# Patient Record
Sex: Female | Born: 1965 | Race: Black or African American | Hispanic: No | Smoking: Never smoker
Health system: Southern US, Community
[De-identification: ages and names within clinical notes are randomized; demographics above are authoritative.]

## PROBLEM LIST (undated history)

## (undated) DIAGNOSIS — R51 Headache: Secondary | ICD-10-CM

## (undated) DIAGNOSIS — F32A Depression, unspecified: Secondary | ICD-10-CM

## (undated) DIAGNOSIS — F329 Major depressive disorder, single episode, unspecified: Secondary | ICD-10-CM

## (undated) DIAGNOSIS — Z8489 Family history of other specified conditions: Secondary | ICD-10-CM

## (undated) DIAGNOSIS — M25512 Pain in left shoulder: Secondary | ICD-10-CM

## (undated) DIAGNOSIS — G709 Myoneural disorder, unspecified: Secondary | ICD-10-CM

## (undated) DIAGNOSIS — D649 Anemia, unspecified: Secondary | ICD-10-CM

## (undated) HISTORY — PX: OTHER SURGICAL HISTORY: SHX169

## (undated) HISTORY — DX: Pain in left shoulder: M25.512

---

## 2002-03-15 ENCOUNTER — Ambulatory Visit (HOSPITAL_COMMUNITY): Admission: RE | Admit: 2002-03-15 | Discharge: 2002-03-15 | Payer: Self-pay | Admitting: Family Medicine

## 2002-03-15 ENCOUNTER — Encounter: Payer: Self-pay | Admitting: Family Medicine

## 2002-09-14 ENCOUNTER — Encounter: Payer: Self-pay | Admitting: Family Medicine

## 2002-09-14 ENCOUNTER — Ambulatory Visit (HOSPITAL_COMMUNITY): Admission: RE | Admit: 2002-09-14 | Discharge: 2002-09-14 | Payer: Self-pay | Admitting: Family Medicine

## 2002-10-22 ENCOUNTER — Inpatient Hospital Stay (HOSPITAL_COMMUNITY): Admission: AD | Admit: 2002-10-22 | Discharge: 2002-10-22 | Payer: Self-pay | Admitting: *Deleted

## 2002-10-22 ENCOUNTER — Encounter (INDEPENDENT_AMBULATORY_CARE_PROVIDER_SITE_OTHER): Payer: Self-pay | Admitting: *Deleted

## 2002-10-22 ENCOUNTER — Encounter: Payer: Self-pay | Admitting: *Deleted

## 2003-02-14 ENCOUNTER — Emergency Department (HOSPITAL_COMMUNITY): Admission: EM | Admit: 2003-02-14 | Discharge: 2003-02-14 | Payer: Self-pay | Admitting: Emergency Medicine

## 2005-04-16 ENCOUNTER — Ambulatory Visit (HOSPITAL_COMMUNITY): Admission: RE | Admit: 2005-04-16 | Discharge: 2005-04-16 | Payer: Self-pay | Admitting: Family Medicine

## 2005-06-02 ENCOUNTER — Ambulatory Visit (HOSPITAL_COMMUNITY): Admission: RE | Admit: 2005-06-02 | Discharge: 2005-06-02 | Payer: Self-pay | Admitting: Family Medicine

## 2005-10-11 ENCOUNTER — Observation Stay (HOSPITAL_COMMUNITY): Admission: AD | Admit: 2005-10-11 | Discharge: 2005-10-11 | Payer: Self-pay | Admitting: Obstetrics & Gynecology

## 2005-11-14 ENCOUNTER — Ambulatory Visit (HOSPITAL_COMMUNITY): Admission: AD | Admit: 2005-11-14 | Discharge: 2005-11-14 | Payer: Self-pay | Admitting: Obstetrics & Gynecology

## 2007-03-24 ENCOUNTER — Encounter: Admission: RE | Admit: 2007-03-24 | Discharge: 2007-03-24 | Payer: Self-pay | Admitting: Family Medicine

## 2008-05-03 ENCOUNTER — Encounter (HOSPITAL_COMMUNITY): Admission: RE | Admit: 2008-05-03 | Discharge: 2008-06-02 | Payer: Self-pay | Admitting: Family Medicine

## 2008-06-03 ENCOUNTER — Encounter (HOSPITAL_COMMUNITY): Admission: RE | Admit: 2008-06-03 | Discharge: 2008-07-03 | Payer: Self-pay | Admitting: Family Medicine

## 2008-06-07 ENCOUNTER — Ambulatory Visit (HOSPITAL_COMMUNITY): Admission: RE | Admit: 2008-06-07 | Discharge: 2008-06-07 | Payer: Self-pay | Admitting: Family Medicine

## 2008-08-06 ENCOUNTER — Encounter: Admission: RE | Admit: 2008-08-06 | Discharge: 2008-08-06 | Payer: Self-pay | Admitting: Obstetrics and Gynecology

## 2009-11-24 ENCOUNTER — Encounter: Admission: RE | Admit: 2009-11-24 | Discharge: 2009-11-24 | Payer: Self-pay | Admitting: Obstetrics and Gynecology

## 2010-06-21 ENCOUNTER — Encounter: Payer: Self-pay | Admitting: *Deleted

## 2010-10-15 ENCOUNTER — Encounter: Payer: Self-pay | Admitting: Gastroenterology

## 2010-10-15 ENCOUNTER — Ambulatory Visit: Payer: Self-pay | Admitting: Urgent Care

## 2010-10-15 ENCOUNTER — Ambulatory Visit (INDEPENDENT_AMBULATORY_CARE_PROVIDER_SITE_OTHER): Payer: Self-pay | Admitting: Gastroenterology

## 2010-10-15 VITALS — BP 122/82 | HR 63 | Temp 97.3°F | Ht 67.0 in | Wt 163.2 lb

## 2010-10-15 DIAGNOSIS — K921 Melena: Secondary | ICD-10-CM
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
## 2010-10-15 NOTE — Patient Instructions (Signed)
Please contact Lubertha Basque for assistance for the upcoming colonoscopy.  We will be setting up a colonoscopy very soon to assess your colon.  Further recommendations after this is completed.

## 2010-10-16 NOTE — Discharge Summary (Signed)
NAME:  Holly Little, CADE NO.:  [id number]   MEDICAL RECORD NO.:  [id number]          PATIENT TYPE:  OBV   LOCATION:  LDR3                          FACILITY:  [facility]   PHYSICIAN:  Richardean Canal, M.D.  DATE OF BIRTH:  03/16/1966   DATE OF ADMISSION:  11/14/2005  DATE OF DISCHARGE:  06/17/2007LH                                 DISCHARGE SUMMARY   HISTORY:  This patient came to the hospital at [redacted] weeks gestation  complaining of leaking of fluid.  The patient had received prenatal care,  and the dates were well established by ultrasound.  The patient was  presenting vertex with leaking fluid.  The Nitrazine test was positive.  Fetal heart rate was normal on the fetal monitor.  The patient was not in  labor, and no contractions were noted on the fetal monitor.  [facility] was contacted for transfer and did not have a bed.  A bed was found  in [place], and the patient was accepted in transfer at [facility].                                            ______________________________  Richardean Canal, M.D.     RW/MEDQ  D:  11/15/2005  T:  11/15/2005  Job:  [id number]

## 2010-10-16 NOTE — Consult Note (Signed)
NAME:  Holly Little, Holly Little                       ACCOUNT NO.:  [id number]   MEDICAL RECORD NO.:  [id number]                   PATIENT TYPE:  MAT   LOCATION:  MATC                                 FACILITY:  [facility]   PHYSICIAN:  Georgina Peer, M.D.              DATE OF BIRTH:  10-16-1965   DATE OF CONSULTATION:  DATE OF DISCHARGE:                                   CONSULTATION   REFERRING PHYSICIAN:  Sheppard Penton. Stacie Acres, M.D.   REASON FOR CONSULTATION:  I was asked to see this patient at the request of  Dr. Mariel Aloe at the [facility] Emergency Department at [facility]. [facility] as a GYN consult to evaluate vaginal bleeding and possible  miscarriage.   HISTORY AND PHYSICAL:  The patient is a 45 year old gravida 5, para 2-1-2-2,  six weeks from her last menstrual period who presented to [facility]  Emergency Department with vaginal bleeding and cramping.  To expedite  radiologic evaluation and consultation, she was transferred to [facility] by [facility].  The patient was known to have an intrauterine  gestational sac on Oct 08, 2002, under the care of Dr. Emelda Fear in  [place], [place].  The patient had a quantitative HCG of 8200 on  Oct 08, 2002, and a follow-up ultrasound showing no cardiac activity and a  diagnosis of a missed AB.  Presenting today, quantitative HCG was 4000.  Her  hemoglobin was 12.3, WBC 11.3, platelet count 176,000.  She was also placed  on Macrobid for a urinary tract infection.   PAST MEDICAL HISTORY:  She has had two vaginal deliveries.  She has no  ongoing medical problems.   ALLERGIES:  No known drug allergies.   MEDICATIONS:  She is on Macrodantin and prenatal vitamins as her only  medications.   PHYSICAL EXAMINATION:  VITAL SIGNS:  Afebrile, blood pressure 134/75.  ABDOMEN:  Soft, flat and nontender.  No rebound, masses or guarding.  PELVIC:  There is dark blood in the vaginal vault.  Clot in the cervix; when  removed,  minimal dark bleeding was noted.  The os was then closed.  Uterus  top normal size.  An ultrasound had shown thickened endometrium  and regular  fluid collection in the endocervical canal, probable SAB.   Her urinalysis revealed 100 of glucose, 40 of ketones, large blood, 100 of  protein, positive nitrites, positive large leukocytes and 7 to 10 white  cells and too numerous to count red cells and a clean catch specimen.  A  culture will be done.   IMPRESSION:  Complete abortion, minimal bleeding.    DISPOSITION:  The patient was given Methergine 0.2 p.o. and Motrin 800 p.o.  She will be watched for one half hour and will be discharged back to her  home to follow up with Dr. Emelda Fear in [place], [place], within  several days.  She states her  blood type is B positive.                                               Georgina Peer, M.D.    JPN/MEDQ  D:  10/23/2002  T:  10/23/2002  Job:  [id number]   cc:   Tilda Burrow, M.D.  [address] [place]  [place] [address]  Fax: [phone]

## 2010-10-16 NOTE — H&P (Signed)
NAME:  Holly Little, Holly Little NO.:  [id number]   MEDICAL RECORD NO.:  [id number]          PATIENT TYPE:  OBV   LOCATION:  LDR2                          FACILITY:  [facility]   PHYSICIAN:  Tilda Burrow, M.D. DATE OF BIRTH:  1965/07/24   DATE OF ADMISSION:  10/11/2005  DATE OF DISCHARGE:  LH                                HISTORY & PHYSICAL   REASON FOR ADMISSION:  Pregnancy at 47 weeks with lower back pain and  cramping for about 7-8 hours.   HISTORY OF PRESENT ILLNESS:  Holly Little is a 45 year old gravida 3, para 2, EDC  is January 10, 2006, who presents to the office with low back pain and  tightening in her lower abdomen since early a.m.   PHYSICAL EXAMINATION:  Vital signs are stable.  Cervix is a fingertip,  thick, posterior, ballots.  Electronic fetal monitor was applied.  The  patient states that since she has been at the hospital her discomfort has  abated and no uterine contractions are palpated or noted on the monitor.   PLAN:  We are going to discharge [place] home.  She is to follow up with  reoccurrence or as needed.  She is to see me in the office Wednesday.  At  that time I will do a fetal fibronectin and possible Brethine therapy due to  the fact that she has had a preterm delivery in the past.      Zerita Boers, Lanier Clam      Tilda Burrow, M.D.  Electronically Signed    DL/MEDQ  D:  16/03/9603  T:  10/11/2005  Job:  [id number]

## 2010-10-16 NOTE — Group Therapy Note (Signed)
NAME:  Holly Little, Holly Little                       ACCOUNT NO.:  [id number]   MEDICAL RECORD NO.:  [id number]                   PATIENT TYPE:   LOCATION:                                       FACILITY:  [facility]   PHYSICIAN:  Langley Gauss, M.D.                DATE OF BIRTH:  1966/04/22   DATE OF PROCEDURE:  09/16/2002  DATE OF DISCHARGE:                                   PROGRESS NOTE   The patient is a 45 year old gravida 5, para 2.  The patient had absolutely  no notable last menstrual period for any dating purposes. The patient was a  new patient in our office initially seen on 09/06/2002 reporting a first  home positive pregnancy test on 09/03/2002 with no other tests having been  done.  The patient did complain of left lower quadrant pain, which she  described as sharp, but this had been present times several months.  In  addition she describes breast tenderness which had also been present times  several months.  A transvaginal ultrasound performed on 09/06/2002 revealed  a very irregular, small intrauterine sac too small to identify any  structures within it.  The ovaries were normal bilaterally with no free  fluid identified and subsequently a quantitative beta hCG obtained on that  day was 258.2.  The patient had been given instructions to follow up 48  hours later for a repeat quantitative beta hCG; however, the patient did not  follow up until 96 hours later at which time a quantitative beta hCG had  only increased to 579.2.  The patient was again seen in the office with full  recognition that this was not a normal doubling time; but again these 2  specimens were not run side-by-side for comparative analysis.   The patient was then seen for a follow up in our office on 09/13/2002.  At  that period of time there was a very small amount of free pelvic fluid.  The  ovaries were normal bilaterally. The intrauterine sac had markedly increased  in size and was well-visualized with a  maximum diameter of 0.050 cm but no  structures visible within this sac.  The sac was regular in shape.  The  patient again had no significant change in her status.  In that she did have  an occasional left lower quadrant pain which she described had been present  times several months duration.  She had absolutely no vaginal bleeding.  She  did have some nausea without vomiting, but no significant change in her  breast tenderness.  The patient was advised again that this was an  inconclusive study as we did not identify any fetal structures within the  sac, however, the patient's asymptomatic nature was reassuring.   Subsequently a repeat quantitative beta hCG results obtained on 09/13/2002  revealed a number of 2512.0.  The patient was advised that this could  represent  a normal intrauterine pregnancy with very early ultrasounds too  soon to identify fetal structures within intrauterine sac.  Another  possibility was that of an abnormal intrauterine pregnancy which could  possibly lead to fetal loss; and, again, until proven otherwise ectopic  pregnancy is a consideration.  The patient subsequently left our office and  we are contacted the following day on 09/14/2002.   The patient had gone to [facility] upon her own accord to obtain  a second opinion.  Report that I have obtained is that the patient had a  transvaginal ultrasound performed at [facility] on 09/14/2002 which  was consistent with the ultrasound findings that we had had from the day  previously.  The patient was not given any information different regarding  this pregnancy than we had already given her; that of possible early, normal  intrauterine pregnancy; abnormal intrauterine pregnancy; or there is the  possibility, still persisting, of an ectopic pregnancy.   Our plan, at present, which has been rearranged with the patient is that the  patient is to follow up at [facility] on 09/18/2002  for repeat  transvaginal ultrasound to assess the ongoing pregnancy.  [facility]  will be cross covering in my absence.                                               Langley Gauss, M.D.    DC/MEDQ  D:  09/16/2002  T:  09/17/2002  Job:  [id number]   cc:   [facility]

## 2010-10-17 DIAGNOSIS — K921 Melena: Secondary | ICD-10-CM | POA: Insufficient documentation

## 2010-10-17 NOTE — Assessment & Plan Note (Signed)
45 year old pleasant female with 2 episodes of large amount painless hematochezia. Denies constipation, hx of hemorrhoids. Did have 2 separate episodes of lower abdominal pain lasting approximately 20 minutes, not associated with episodes of hematochezia. Denies fever, chills, wt loss. Complains of fatigue, reportedly anemic. No current labs available (Hgb checked at PCP's office via fingerstick). FH of colon cancer in 1st degree relative (father), diagnosed at age 19, last year, living. Hematochezia may be related to benign anorectal source, yet with family hx, concern for occult malignancy. Will proceed with colonoscopy in the very near future.  TCS with Dr. Darrick Penna: the R/B/A have been discussed in detail with the pt. She stated understanding and has no further questions. She is agreeable to proceeding. Obtain financial assistance with Lubertha Basque ASAP.  Attempt to retrieve most recent results of Hgb.

## 2010-10-19 NOTE — Progress Notes (Signed)
Cc to PCP 

## 2010-10-27 ENCOUNTER — Encounter: Payer: Self-pay | Admitting: Gastroenterology

## 2010-10-30 NOTE — Progress Notes (Signed)
agree

## 2010-11-09 ENCOUNTER — Ambulatory Visit (HOSPITAL_COMMUNITY): Admission: RE | Admit: 2010-11-09 | Payer: Self-pay | Source: Ambulatory Visit | Admitting: Gastroenterology

## 2010-11-09 ENCOUNTER — Encounter: Payer: Self-pay | Admitting: Gastroenterology

## 2010-12-21 ENCOUNTER — Encounter: Payer: Self-pay | Admitting: General Practice

## 2010-12-21 ENCOUNTER — Telehealth: Payer: Self-pay | Admitting: General Practice

## 2010-12-21 NOTE — Telephone Encounter (Signed)
Instructed pt to hold her Iron for 7 days prior to her procedure.

## 2010-12-21 NOTE — Telephone Encounter (Signed)
Per Dr Darrick Penna its okay for pt to do Trilyte Prep

## 2010-12-21 NOTE — Telephone Encounter (Signed)
Hold iron for 7 days. MOVIPREP. 

## 2010-12-21 NOTE — Telephone Encounter (Signed)
Gastroenterology Pre-Procedure Form  Request Date: 12/21/2010,  Requesting Physician: DR. Lubertha South     PATIENT INFORMATION:  Holly Little is a 45 y.o., female (DOB=03/30/1966).  PROCEDURE: Procedure(s) requested: colonoscopy Procedure Reason: rectal bleeding  PATIENT REVIEW QUESTIONS: The patient reports the following:   1. Diabetes Melitis: no 2. Joint replacements in the past 12 months: no 3. Major health problems in the past 3 months: no 4. Has an artificial valve or MVP:no 5. Has been advised in past to take antibiotics in advance of a procedure like teeth cleaning: no}    MEDICATIONS & ALLERGIES:    Patient reports the following regarding taking any blood thinners:   Plavix?  Aspirin?no Coumadin?  no  Patient confirms/reports the following medications:  Current Outpatient Prescriptions  Medication Sig Dispense Refill  . ferrous sulfate 325 (65 FE) MG tablet Take 325 mg by mouth daily with breakfast.        . Multiple Vitamin (MULTIVITAMIN) capsule Take 1 capsule by mouth daily.          Patient confirms/reports the following allergies:  No Known Allergies  Patient is appropriate to schedule for requested procedure(s): yes  AUTHORIZATION INFORMATION Primary Insurance: SELF PAY,    SCHEDULE INFORMATION: Procedure has been scheduled as follows:  Date: 01/04/2011, Time: 10:30AM  Location: [facility]  This Gastroenterology Pre-Precedure Form is being routed to the following provider(s) for review: [name], MD

## 2011-01-01 ENCOUNTER — Encounter: Payer: Self-pay | Admitting: Gastroenterology

## 2011-01-04 ENCOUNTER — Encounter (HOSPITAL_COMMUNITY)
Admission: RE | Disposition: A | Discharge status: Home / Self Care | Payer: Self-pay | Source: Ambulatory Visit | Attending: Gastroenterology

## 2011-01-04 ENCOUNTER — Encounter (HOSPITAL_COMMUNITY): Payer: Self-pay

## 2011-01-04 ENCOUNTER — Encounter: Payer: Self-pay | Admitting: Gastroenterology

## 2011-01-04 ENCOUNTER — Other Ambulatory Visit: Payer: Self-pay | Admitting: Gastroenterology

## 2011-01-04 ENCOUNTER — Ambulatory Visit (HOSPITAL_COMMUNITY)
Admission: RE | Admit: 2011-01-04 | Discharge: 2011-01-04 | Disposition: A | Discharge status: Home / Self Care | Payer: Self-pay | Source: Ambulatory Visit | Attending: Gastroenterology | Admitting: Gastroenterology

## 2011-01-04 DIAGNOSIS — Z800 Family history of malignant neoplasm of digestive organs: Secondary | ICD-10-CM

## 2011-01-04 DIAGNOSIS — D126 Benign neoplasm of colon, unspecified: Secondary | ICD-10-CM | POA: Insufficient documentation

## 2011-01-04 DIAGNOSIS — K921 Melena: Secondary | ICD-10-CM | POA: Insufficient documentation

## 2011-01-04 DIAGNOSIS — K648 Other hemorrhoids: Secondary | ICD-10-CM | POA: Insufficient documentation

## 2011-01-04 DIAGNOSIS — K625 Hemorrhage of anus and rectum: Secondary | ICD-10-CM

## 2011-01-04 DIAGNOSIS — R109 Unspecified abdominal pain: Secondary | ICD-10-CM | POA: Insufficient documentation

## 2011-01-04 HISTORY — DX: Anemia, unspecified: D64.9

## 2011-01-04 HISTORY — DX: Headache: R51

## 2011-01-04 HISTORY — DX: Depression, unspecified: F32.A

## 2011-01-04 HISTORY — PX: COLONOSCOPY: SHX5424

## 2011-01-04 HISTORY — DX: Major depressive disorder, single episode, unspecified: F32.9

## 2011-01-04 SURGERY — COLONOSCOPY
Anesthesia: Moderate Sedation

## 2011-01-04 MED ORDER — MIDAZOLAM HCL 5 MG/5ML IJ SOLN
INTRAMUSCULAR | Status: DC | PRN
  Administered 2011-01-04 (×2): 2 mg via INTRAVENOUS

## 2011-01-04 MED ORDER — MIDAZOLAM HCL 5 MG/5ML IJ SOLN
INTRAMUSCULAR | Status: AC
  Filled 2011-01-04: qty 10

## 2011-01-04 MED ORDER — MEPERIDINE HCL 100 MG/ML IJ SOLN
INTRAMUSCULAR | Status: DC | PRN
  Administered 2011-01-04: 50 mg via INTRAVENOUS
  Administered 2011-01-04: 25 mg via INTRAVENOUS

## 2011-01-04 MED ORDER — STERILE WATER FOR IRRIGATION IR SOLN
Status: DC | PRN
  Administered 2011-01-04: 12:00:00

## 2011-01-04 MED ORDER — MEPERIDINE HCL 100 MG/ML IJ SOLN
INTRAMUSCULAR | Status: AC
  Filled 2011-01-04: qty 2

## 2011-01-04 NOTE — Interval H&P Note (Signed)
History and Physical Interval Note:   01/04/2011   11:36 AM   Holly Little  has presented today for surgery, with the diagnosis of rectal bleeding  The various methods of treatment have been discussed with the patient and family. After consideration of risks, benefits and other options for treatment, the patient has consented to  Procedure(s): COLONOSCOPY as a surgical intervention .  I have reviewed the patients' chart and labs.  Questions were answered to the patient's satisfaction.     [name]  MD
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
## 2011-01-06 ENCOUNTER — Telehealth: Payer: Self-pay | Admitting: Gastroenterology

## 2011-01-06 NOTE — Telephone Encounter (Signed)
Tried to call pt- NA 

## 2011-01-06 NOTE — Telephone Encounter (Signed)
Results Cc to PCP 5 yr reminder is in the computer

## 2011-01-06 NOTE — Telephone Encounter (Signed)
Please call pt. She had a hyperplastic polyp removed. TCS in 5 years because her father had colonCA. High fiber diet.

## 2011-01-12 NOTE — Telephone Encounter (Signed)
Pt aware, she stated she has not had a BM since the procedure. Pt wants to know what would be the best thing for her to try. Please advise.

## 2011-01-12 NOTE — Telephone Encounter (Signed)
Please call pt. She should stop iron until she has a BM. She should drink 6-8 cups of water daily and add Miralax twice daily for 3 days then once daily.

## 2011-01-13 ENCOUNTER — Encounter (HOSPITAL_COMMUNITY): Payer: Self-pay | Admitting: Gastroenterology

## 2011-01-13 NOTE — Telephone Encounter (Signed)
Tried to call pt- NA 

## 2011-01-14 NOTE — Telephone Encounter (Signed)
Pt aware.

## 2011-04-16 ENCOUNTER — Telehealth: Payer: Self-pay | Admitting: Gastroenterology

## 2011-04-16 ENCOUNTER — Encounter: Payer: Self-pay | Admitting: Gastroenterology

## 2011-04-16 NOTE — Telephone Encounter (Signed)
Pt is on Dec 2012 recall list to have CBC prior to 4 month OV

## 2011-04-19 ENCOUNTER — Other Ambulatory Visit: Payer: Self-pay

## 2011-04-19 DIAGNOSIS — K921 Melena: Secondary | ICD-10-CM

## 2011-04-19 NOTE — Telephone Encounter (Signed)
Lab order on file for 05/03/2011. To go out end of Nov.

## 2011-05-13 ENCOUNTER — Ambulatory Visit: Payer: Self-pay | Admitting: Gastroenterology

## 2012-11-23 ENCOUNTER — Ambulatory Visit (INDEPENDENT_AMBULATORY_CARE_PROVIDER_SITE_OTHER): Payer: Self-pay | Admitting: Nurse Practitioner

## 2012-11-23 ENCOUNTER — Encounter: Payer: Self-pay | Admitting: Nurse Practitioner

## 2012-11-23 VITALS — BP 132/90 | Temp 98.5°F | Wt 168.4 lb

## 2012-11-23 DIAGNOSIS — R5383 Other fatigue: Secondary | ICD-10-CM

## 2012-11-23 DIAGNOSIS — R102 Pelvic and perineal pain: Secondary | ICD-10-CM

## 2012-11-23 DIAGNOSIS — R5381 Other malaise: Secondary | ICD-10-CM

## 2012-11-23 DIAGNOSIS — N912 Amenorrhea, unspecified: Secondary | ICD-10-CM

## 2012-11-23 DIAGNOSIS — N949 Unspecified condition associated with female genital organs and menstrual cycle: Secondary | ICD-10-CM

## 2012-11-24 ENCOUNTER — Encounter: Payer: Self-pay | Admitting: Nurse Practitioner

## 2012-11-24 DIAGNOSIS — N912 Amenorrhea, unspecified: Secondary | ICD-10-CM | POA: Insufficient documentation

## 2012-11-24 LAB — HCG, SERUM, QUALITATIVE: Preg, Serum: NEGATIVE

## 2012-11-24 LAB — CBC WITH DIFFERENTIAL/PLATELET
Basophils Absolute: 0 10*3/uL (ref 0.0–0.1)
Basophils Relative: 0 % (ref 0–1)
Eosinophils Absolute: 0.1 10*3/uL (ref 0.0–0.7)
Eosinophils Relative: 2 % (ref 0–5)
HCT: 36.6 % (ref 36.0–46.0)
Hemoglobin: 12.2 g/dL (ref 12.0–15.0)
Monocytes Absolute: 0.4 10*3/uL (ref 0.1–1.0)
Monocytes Relative: 8 % (ref 3–12)
Neutro Abs: 3 10*3/uL (ref 1.7–7.7)
RBC: 4.33 MIL/uL (ref 3.87–5.11)
RDW: 13.8 % (ref 11.5–15.5)
WBC: 5.5 10*3/uL (ref 4.0–10.5)

## 2012-11-24 LAB — HEPATIC FUNCTION PANEL
ALT: 19 U/L (ref 0–35)
AST: 21 U/L (ref 0–37)
Albumin: 4.3 g/dL (ref 3.5–5.2)
Alkaline Phosphatase: 51 U/L (ref 39–117)
Total Protein: 7.5 g/dL (ref 6.0–8.3)

## 2012-11-24 LAB — BASIC METABOLIC PANEL
Calcium: 9.8 mg/dL (ref 8.4–10.5)
Chloride: 104 mEq/L (ref 96–112)
Potassium: 3.9 mEq/L (ref 3.5–5.3)

## 2012-11-24 LAB — PROGESTERONE: Progesterone: 0.3 ng/mL

## 2012-11-24 LAB — FOLLICLE STIMULATING HORMONE: FSH: 58 m[IU]/mL

## 2012-11-24 NOTE — Progress Notes (Signed)
Subjective:  Presents for complaints of weight gain, abdominal bloating, night sweats and breast tenderness. Has not had a menstrual cycle in 4 years. Some fatigue. Sleeping well. No unusual stress. No fever. No vaginal discharge. Will have an occasional very brief sharp pelvic pain every 2-3 weeks. Resolves on its own. Same sexual partner. Takes vitamin D daily. Limited amount of workup due to lack of insurance.  Objective:   BP 132/90  Temp(Src) 98.5 F (36.9 C) (Oral)  Wt 168 lb 6.4 oz (76.386 kg)  BMI 26.37 kg/m2 NAD. Alert, oriented. Thyroid normal limit to palpation and nontender. Lungs clear. Heart regular rate rhythm. Abdomen mild soft distention, nontender. No obvious organomegaly.  Assessment/Plan:Absence of menstruation - Plan: POCT urine pregnancy, FSH, LH, Estradiol, Progesterone, hCG, serum, qualitative, FSH, LH, Estradiol, Progesterone, hCG, serum, qualitative  Pelvic pain in female - Plan: GC/chlamydia probe amp, urine, FSH, LH, Estradiol, Progesterone, hCG, serum, qualitative, GC/chlamydia probe amp, urine, FSH, LH, Estradiol, Progesterone, hCG, serum, qualitative  Other malaise and fatigue - Plan: Basic metabolic panel, CBC with Differential, Hepatic function panel, TSH, Basic metabolic panel, CBC with Differential, Hepatic function panel, TSH  depending on lab work, patient may need pelvic ultrasound.

## 2012-11-24 NOTE — Assessment & Plan Note (Signed)
Assessment/Plan:Absence of menstruation - Plan: POCT urine pregnancy, FSH, LH, Estradiol, Progesterone, hCG, serum, qualitative, FSH, LH, Estradiol, Progesterone, hCG, serum, qualitative

## 2012-11-25 LAB — GC/CHLAMYDIA PROBE AMP: GC Probe RNA: NEGATIVE

## 2012-12-13 ENCOUNTER — Emergency Department (HOSPITAL_COMMUNITY)
Admission: EM | Admit: 2012-12-13 | Discharge: 2012-12-13 | Disposition: A | Discharge status: Home / Self Care | Payer: Worker's Compensation | Attending: Emergency Medicine | Admitting: Emergency Medicine

## 2012-12-13 ENCOUNTER — Encounter (HOSPITAL_COMMUNITY): Payer: Self-pay | Admitting: *Deleted

## 2012-12-13 ENCOUNTER — Emergency Department (HOSPITAL_COMMUNITY): Payer: Worker's Compensation

## 2012-12-13 DIAGNOSIS — S39012A Strain of muscle, fascia and tendon of lower back, initial encounter: Secondary | ICD-10-CM

## 2012-12-13 DIAGNOSIS — S9002XA Contusion of left ankle, initial encounter: Secondary | ICD-10-CM

## 2012-12-13 DIAGNOSIS — Y9389 Activity, other specified: Secondary | ICD-10-CM | POA: Insufficient documentation

## 2012-12-13 DIAGNOSIS — Z862 Personal history of diseases of the blood and blood-forming organs and certain disorders involving the immune mechanism: Secondary | ICD-10-CM | POA: Insufficient documentation

## 2012-12-13 DIAGNOSIS — Z8659 Personal history of other mental and behavioral disorders: Secondary | ICD-10-CM | POA: Insufficient documentation

## 2012-12-13 DIAGNOSIS — S335XXA Sprain of ligaments of lumbar spine, initial encounter: Secondary | ICD-10-CM | POA: Insufficient documentation

## 2012-12-13 DIAGNOSIS — Y92410 Unspecified street and highway as the place of occurrence of the external cause: Secondary | ICD-10-CM | POA: Insufficient documentation

## 2012-12-13 DIAGNOSIS — S9000XA Contusion of unspecified ankle, initial encounter: Secondary | ICD-10-CM | POA: Insufficient documentation

## 2012-12-13 MED ORDER — TRAMADOL HCL 50 MG PO TABS: 50.0000 mg | ORAL_TABLET | Freq: Four times a day (QID) | ORAL | Status: DC | PRN
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
## 2013-02-26 ENCOUNTER — Other Ambulatory Visit: Payer: Self-pay | Admitting: Emergency Medicine

## 2013-03-15 ENCOUNTER — Telehealth: Payer: Self-pay

## 2013-03-15 NOTE — Telephone Encounter (Signed)
[facility] orders from Dr. Clelia Croft for the patients appointment tomorrow at 8am. Their fax number is: [phone]

## 2013-03-16 NOTE — Telephone Encounter (Signed)
Is this work comp? If so please pull chart.

## 2013-03-16 NOTE — Telephone Encounter (Signed)
??    No note in epic that she has been seen at [facility].  Was this a worker's comp pt? The name does not ring a bell.  Could this be meant for a different Dr. Clelia Croft in town?

## 2013-04-13 ENCOUNTER — Telehealth: Payer: Self-pay

## 2013-04-13 NOTE — Telephone Encounter (Signed)
[facility] called regarding pts treatment. Pt states dr Clelia Croft wants her to continue her PT. [facility] states they do not have a continuing order for PT and if dr Clelia Croft would like pt to continue therapy, they need a continuing order faxed to them.  [facility] Phone: [phone] Fax: [phone]  bf

## 2013-04-13 NOTE — Telephone Encounter (Signed)
This is WC, Kriste Basque will send note in [facility], Sebastian Lurz

## 2013-05-09 ENCOUNTER — Telehealth: Payer: Self-pay | Admitting: Radiology

## 2013-05-09 NOTE — Telephone Encounter (Signed)
Discussed case w/ RJ - gets relief from PT but then her sxs will recur outside of office, so she has had any improvement in functional strength.  This is work comp, please print out and pull chart. I do not remember this case. If she has plateaued, then she definitely needs to come back in for further eval.

## 2013-05-09 NOTE — Telephone Encounter (Signed)
Spoke to physical therapy, patient has improved some, wants to discuss with you. Call back number for therapist [phone] RJ is his name. He wants to discuss if you think patient could come back in for an injection to get her some improvement, she seems to have plateaued.

## 2013-05-09 NOTE — Telephone Encounter (Signed)
Sorry, means has NOT had improvement in functional strength. RJ thinks that if she were on a steroid dose pack or had a steroid injection to decrease the inflammation that she might then be able to regain some strength in PT.  Advised the pt needs to come in for OV for eval and considering of steroid or injection. Pt will plan to come in this evening.  Please print note and place in [facility] chart. Thanks.

## 2013-08-13 ENCOUNTER — Other Ambulatory Visit: Payer: Self-pay | Admitting: Neurosurgery

## 2013-10-16 ENCOUNTER — Encounter (HOSPITAL_COMMUNITY): Admission: RE | Payer: Self-pay | Source: Ambulatory Visit

## 2013-10-16 ENCOUNTER — Inpatient Hospital Stay (HOSPITAL_COMMUNITY)
Admission: RE | Admit: 2013-10-16 | Payer: No Typology Code available for payment source | Source: Ambulatory Visit | Admitting: Neurosurgery

## 2013-10-16 SURGERY — POSTERIOR LUMBAR FUSION 1 LEVEL
Anesthesia: General | Site: Back

## 2013-11-22 DIAGNOSIS — Z0289 Encounter for other administrative examinations: Secondary | ICD-10-CM

## 2015-11-26 ENCOUNTER — Encounter: Payer: Self-pay | Admitting: Gastroenterology

## 2016-10-04 ENCOUNTER — Emergency Department (HOSPITAL_COMMUNITY): Payer: BLUE CROSS/BLUE SHIELD

## 2016-10-04 ENCOUNTER — Encounter (HOSPITAL_COMMUNITY): Payer: Self-pay | Admitting: Emergency Medicine

## 2016-10-04 ENCOUNTER — Emergency Department (HOSPITAL_COMMUNITY)
Admission: EM | Admit: 2016-10-04 | Discharge: 2016-10-04 | Disposition: A | Discharge status: Home / Self Care | Payer: BLUE CROSS/BLUE SHIELD | Attending: Emergency Medicine | Admitting: Emergency Medicine

## 2016-10-04 DIAGNOSIS — S3992XA Unspecified injury of lower back, initial encounter: Secondary | ICD-10-CM | POA: Diagnosis present

## 2016-10-04 DIAGNOSIS — S161XXA Strain of muscle, fascia and tendon at neck level, initial encounter: Secondary | ICD-10-CM | POA: Insufficient documentation

## 2016-10-04 DIAGNOSIS — Y9389 Activity, other specified: Secondary | ICD-10-CM | POA: Diagnosis not present

## 2016-10-04 DIAGNOSIS — Y92410 Unspecified street and highway as the place of occurrence of the external cause: Secondary | ICD-10-CM | POA: Diagnosis not present

## 2016-10-04 DIAGNOSIS — S63501A Unspecified sprain of right wrist, initial encounter: Secondary | ICD-10-CM | POA: Diagnosis not present

## 2016-10-04 DIAGNOSIS — S39012A Strain of muscle, fascia and tendon of lower back, initial encounter: Secondary | ICD-10-CM | POA: Insufficient documentation

## 2016-10-04 DIAGNOSIS — Y999 Unspecified external cause status: Secondary | ICD-10-CM | POA: Diagnosis not present

## 2016-10-04 MED ORDER — METHOCARBAMOL 500 MG PO TABS: 500.0000 mg | ORAL_TABLET | Freq: Three times a day (TID) | ORAL | 0 refills | Status: DC

## 2016-10-04 MED ORDER — IBUPROFEN 800 MG PO TABS: 800.0000 mg | ORAL_TABLET | Freq: Three times a day (TID) | ORAL | 0 refills | Status: DC

## 2016-10-04 MED ORDER — TRAMADOL HCL 50 MG PO TABS: 50.0000 mg | ORAL_TABLET | Freq: Four times a day (QID) | ORAL | 0 refills | Status: DC | PRN

## 2016-10-04 MED ORDER — IBUPROFEN 800 MG PO TABS
800.0000 mg | ORAL_TABLET | Freq: Once | ORAL | Status: AC
  Administered 2016-10-04: 800 mg via ORAL
  Filled 2016-10-04: qty 1
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
## 2016-10-04 NOTE — Discharge Instructions (Signed)
Apply ice packs on/off your neck and back.  Follow-up with your primary provider in a few days if the symptoms are not improving.  Return here for any worsening symptoms
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
## 2016-10-04 NOTE — ED Notes (Signed)
Tammy notified that hr dropped to 51, is at bedside at this time, and is okay with pt being discharged at this time.

## 2016-10-04 NOTE — ED Notes (Signed)
Pt made aware to return if symptoms worsen or if any life threatening symptoms occur.   

## 2016-10-11 ENCOUNTER — Encounter: Payer: Self-pay | Admitting: Family Medicine

## 2016-10-11 ENCOUNTER — Ambulatory Visit (INDEPENDENT_AMBULATORY_CARE_PROVIDER_SITE_OTHER): Payer: Self-pay | Admitting: Family Medicine

## 2016-10-11 VITALS — BP 132/84 | Ht 67.0 in | Wt 175.0 lb

## 2016-10-11 DIAGNOSIS — S161XXA Strain of muscle, fascia and tendon at neck level, initial encounter: Secondary | ICD-10-CM

## 2016-10-11 DIAGNOSIS — M545 Low back pain, unspecified: Secondary | ICD-10-CM

## 2016-10-11 MED ORDER — ETODOLAC 400 MG PO TABS: 400.0000 mg | ORAL_TABLET | Freq: Two times a day (BID) | ORAL | 0 refills | Status: DC

## 2016-10-11 MED ORDER — METHOCARBAMOL 500 MG PO TABS: 500.0000 mg | ORAL_TABLET | Freq: Three times a day (TID) | ORAL | 0 refills | Status: DC | PRN

## 2016-10-11 MED ORDER — TRAMADOL HCL 50 MG PO TABS: 50.0000 mg | ORAL_TABLET | Freq: Four times a day (QID) | ORAL | 0 refills | Status: DC | PRN
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
## 2016-10-20 ENCOUNTER — Encounter: Payer: Self-pay | Admitting: Family Medicine

## 2016-10-20 ENCOUNTER — Ambulatory Visit (INDEPENDENT_AMBULATORY_CARE_PROVIDER_SITE_OTHER): Payer: Self-pay | Admitting: Family Medicine

## 2016-10-20 VITALS — BP 122/82 | Ht 67.0 in | Wt 177.5 lb

## 2016-10-20 DIAGNOSIS — S161XXA Strain of muscle, fascia and tendon at neck level, initial encounter: Secondary | ICD-10-CM

## 2016-10-20 DIAGNOSIS — G8911 Acute pain due to trauma: Secondary | ICD-10-CM

## 2016-10-20 DIAGNOSIS — M778 Other enthesopathies, not elsewhere classified: Secondary | ICD-10-CM

## 2016-10-20 DIAGNOSIS — M25512 Pain in left shoulder: Secondary | ICD-10-CM
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
## 2016-10-26 ENCOUNTER — Encounter: Payer: Self-pay | Admitting: Orthopaedic Surgery

## 2016-10-26 ENCOUNTER — Ambulatory Visit (INDEPENDENT_AMBULATORY_CARE_PROVIDER_SITE_OTHER): Payer: Self-pay | Admitting: Orthopaedic Surgery

## 2016-10-26 VITALS — BP 133/85 | HR 70 | Temp 97.7°F | Ht 66.0 in | Wt 175.0 lb

## 2016-10-26 DIAGNOSIS — S63641A Sprain of metacarpophalangeal joint of right thumb, initial encounter: Secondary | ICD-10-CM

## 2016-10-26 DIAGNOSIS — M542 Cervicalgia: Secondary | ICD-10-CM

## 2016-10-26 DIAGNOSIS — S5331XA Traumatic rupture of right ulnar collateral ligament, initial encounter: Secondary | ICD-10-CM
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
## 2016-11-03 ENCOUNTER — Ambulatory Visit (INDEPENDENT_AMBULATORY_CARE_PROVIDER_SITE_OTHER): Payer: Self-pay | Admitting: Family Medicine

## 2016-11-03 ENCOUNTER — Encounter: Payer: Self-pay | Admitting: Family Medicine

## 2016-11-03 ENCOUNTER — Ambulatory Visit (HOSPITAL_COMMUNITY): Payer: No Typology Code available for payment source

## 2016-11-03 VITALS — BP 128/80 | Ht 67.0 in | Wt 177.0 lb

## 2016-11-03 DIAGNOSIS — S161XXA Strain of muscle, fascia and tendon at neck level, initial encounter: Secondary | ICD-10-CM

## 2016-11-03 MED ORDER — METHOCARBAMOL 500 MG PO TABS: 500.0000 mg | ORAL_TABLET | Freq: Three times a day (TID) | ORAL | 0 refills | Status: DC | PRN

## 2016-11-03 MED ORDER — ETODOLAC 400 MG PO TABS: 400.0000 mg | ORAL_TABLET | Freq: Two times a day (BID) | ORAL | 0 refills | Status: DC

## 2016-11-03 MED ORDER — TRAMADOL HCL 50 MG PO TABS: 50.0000 mg | ORAL_TABLET | Freq: Four times a day (QID) | ORAL | 0 refills | Status: DC | PRN
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
## 2016-11-05 ENCOUNTER — Ambulatory Visit (HOSPITAL_COMMUNITY): Payer: BLUE CROSS/BLUE SHIELD

## 2016-11-05 ENCOUNTER — Encounter (HOSPITAL_COMMUNITY): Payer: Self-pay

## 2016-11-05 ENCOUNTER — Ambulatory Visit (HOSPITAL_COMMUNITY): Payer: BLUE CROSS/BLUE SHIELD | Attending: Family Medicine

## 2016-11-05 ENCOUNTER — Ambulatory Visit (HOSPITAL_COMMUNITY): Payer: Self-pay

## 2016-11-05 DIAGNOSIS — M542 Cervicalgia: Secondary | ICD-10-CM

## 2016-11-05 DIAGNOSIS — R29898 Other symptoms and signs involving the musculoskeletal system: Secondary | ICD-10-CM | POA: Diagnosis not present

## 2016-11-05 DIAGNOSIS — M25612 Stiffness of left shoulder, not elsewhere classified: Secondary | ICD-10-CM | POA: Diagnosis present

## 2016-11-05 DIAGNOSIS — M25512 Pain in left shoulder: Secondary | ICD-10-CM | POA: Insufficient documentation
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
## 2016-11-05 NOTE — Patient Instructions (Addendum)
  Scalene Stretch, Sitting  Repeat __10_ times per session. Do _1-2__ sessions per day.  Copyright  VHI. All rights reserved.  Scalene Stretch, Sitting   Sit, one hand tucked under hip on side to be stretched, other hand over top of head. Gently pull head to side and backwards. Hold ___ seconds. For more stretch, lean body in direction of head pull. Repeat ___ times per session. Do ___ sessions per day.  Copyright  VHI. All rights reserved.  Side Bend, Standing   Stand, one hand grasping other arm above wrist behind body. Pull down while gently tilting head toward pulling side. Hold ___ seconds. Repeat ___ times per session. Do ___ sessions per day.  Copyright  VHI. All rights reserved.  Levator Scapula Stretch, Sitting   Sit, one hand on same-side shoulder blade, other hand on head. Gently pull head down and away. Hold ___ seconds. Repeat ___ times per session. Do ___ sessions per day.  Copyright  VHI. All rights reserved.  Lower Cervical / Upper Thoracic Stretch   Clasp hands together in front with arms extended. Gently pull shoulder blades apart and bend head forward. Hold ____ seconds. Repeat ____ times per set. Do ____ sets per session. Do ____ sessions per day.  http://orth.exer.us/354   Copyright  VHI. All rights reserved.  Axial Extension (Chin Tuck)   Pull chin in and lengthen back of neck. Hold ____ seconds while counting out loud. Repeat ____ times. Do ____ sessions per day.  http://gt2.exer.us/449   Copyright  VHI. All rights reserved.    Doorway Stretch  Place each hand opposite each other on the doorway. (You can change where you feel the stretch by moving arms higher or lower.) Step through with one foot and bend front knee until a stretch is felt and hold. Step through with the opposite foot on the next rep. Hold for __10___ seconds. Repeat __2__times.     Scapular Retraction (Standing)   With arms at sides, pinch shoulder blades  together. Repeat __10__ times per set. Do ____ sets per session. Do ____ sessions per day.  http://orth.exer.us/944   Copyright  VHI. All rights reserved.      Posterior Capsule Stretch   Stand or sit, one arm across body so hand rests over opposite shoulder. Gently push on crossed elbow with other hand until stretch is felt in shoulder of crossed arm. Hold _10__ seconds.  Repeat _1__ times per session. Do ___ sessions per day.   Wall Flexion  Using a towel, slide your arm up the wall until a stretch is felt in your shoulder . Hold for 10 seconds. Repeat 2 times.

## 2016-11-08 ENCOUNTER — Telehealth (HOSPITAL_COMMUNITY): Payer: Self-pay

## 2016-11-08 ENCOUNTER — Ambulatory Visit (HOSPITAL_COMMUNITY): Payer: BLUE CROSS/BLUE SHIELD

## 2016-11-08 NOTE — Telephone Encounter (Signed)
Called patient regarding no show. Patient reports that she confused her appointment today with her appointment with Dr. Luna Glasgow tomorrow. Did not realize that she had mixed up her appointments. We'll put her on the wait list if anything opens up before her next appointment on Thursday. Patient is aware of her next scheduled appointment.   Ailene Ravel, OTR/L,CBIS  [phone]

## 2016-11-09 ENCOUNTER — Encounter: Payer: Self-pay | Admitting: Orthopaedic Surgery

## 2016-11-09 ENCOUNTER — Ambulatory Visit (INDEPENDENT_AMBULATORY_CARE_PROVIDER_SITE_OTHER): Payer: Self-pay | Admitting: Orthopaedic Surgery

## 2016-11-09 VITALS — BP 113/78 | HR 69 | Temp 97.7°F | Ht 67.0 in | Wt 174.0 lb

## 2016-11-09 DIAGNOSIS — S5331XD Traumatic rupture of right ulnar collateral ligament, subsequent encounter: Secondary | ICD-10-CM

## 2016-11-09 DIAGNOSIS — M542 Cervicalgia: Secondary | ICD-10-CM

## 2016-11-09 DIAGNOSIS — S63641D Sprain of metacarpophalangeal joint of right thumb, subsequent encounter: Secondary | ICD-10-CM
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
## 2016-11-11 ENCOUNTER — Encounter (HOSPITAL_COMMUNITY): Payer: Self-pay

## 2016-11-11 ENCOUNTER — Ambulatory Visit (HOSPITAL_COMMUNITY): Payer: BLUE CROSS/BLUE SHIELD

## 2016-11-11 DIAGNOSIS — M25612 Stiffness of left shoulder, not elsewhere classified: Secondary | ICD-10-CM

## 2016-11-11 DIAGNOSIS — M25512 Pain in left shoulder: Secondary | ICD-10-CM

## 2016-11-11 DIAGNOSIS — R29898 Other symptoms and signs involving the musculoskeletal system: Secondary | ICD-10-CM

## 2016-11-11 DIAGNOSIS — M542 Cervicalgia: Secondary | ICD-10-CM
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
## 2016-11-18 ENCOUNTER — Encounter (HOSPITAL_COMMUNITY): Payer: Self-pay

## 2016-11-18 ENCOUNTER — Ambulatory Visit (HOSPITAL_COMMUNITY): Payer: BLUE CROSS/BLUE SHIELD

## 2016-11-18 DIAGNOSIS — M25612 Stiffness of left shoulder, not elsewhere classified: Secondary | ICD-10-CM

## 2016-11-18 DIAGNOSIS — R29898 Other symptoms and signs involving the musculoskeletal system: Secondary | ICD-10-CM | POA: Diagnosis not present

## 2016-11-18 DIAGNOSIS — M25512 Pain in left shoulder: Secondary | ICD-10-CM
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
## 2016-11-22 ENCOUNTER — Ambulatory Visit (HOSPITAL_COMMUNITY): Payer: BLUE CROSS/BLUE SHIELD | Admitting: Occupational Therapy

## 2016-11-22 ENCOUNTER — Encounter (HOSPITAL_COMMUNITY): Payer: Self-pay | Admitting: Occupational Therapy

## 2016-11-22 DIAGNOSIS — M25512 Pain in left shoulder: Secondary | ICD-10-CM

## 2016-11-22 DIAGNOSIS — R29898 Other symptoms and signs involving the musculoskeletal system: Secondary | ICD-10-CM | POA: Diagnosis not present

## 2016-11-22 DIAGNOSIS — M542 Cervicalgia: Secondary | ICD-10-CM

## 2016-11-22 DIAGNOSIS — M25612 Stiffness of left shoulder, not elsewhere classified: Secondary | ICD-10-CM
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
## 2016-11-24 ENCOUNTER — Encounter: Payer: Self-pay | Admitting: Family Medicine

## 2016-11-24 ENCOUNTER — Ambulatory Visit (INDEPENDENT_AMBULATORY_CARE_PROVIDER_SITE_OTHER): Payer: Self-pay | Admitting: Family Medicine

## 2016-11-24 VITALS — BP 128/82 | Ht 67.0 in | Wt 175.2 lb

## 2016-11-24 DIAGNOSIS — S161XXA Strain of muscle, fascia and tendon at neck level, initial encounter: Secondary | ICD-10-CM

## 2016-11-24 MED ORDER — ETODOLAC 400 MG PO TABS: 400.0000 mg | ORAL_TABLET | Freq: Two times a day (BID) | ORAL | 0 refills | Status: DC

## 2016-11-24 MED ORDER — METHOCARBAMOL 500 MG PO TABS
500.0000 mg | ORAL_TABLET | Freq: Three times a day (TID) | ORAL | 0 refills | Status: DC | PRN
Start: 2016-11-24 — End: 2017-02-24

## 2016-11-24 MED ORDER — TRAMADOL HCL 50 MG PO TABS: 50.0000 mg | ORAL_TABLET | Freq: Four times a day (QID) | ORAL | 0 refills | Status: DC | PRN
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
## 2016-11-25 ENCOUNTER — Telehealth: Payer: Self-pay

## 2016-11-25 MED ORDER — CHLORZOXAZONE 500 MG PO TABS: 500.0000 mg | ORAL_TABLET | Freq: Three times a day (TID) | ORAL | 0 refills | Status: DC | PRN

## 2016-11-25 NOTE — Telephone Encounter (Signed)
Change to chlorzoxzone 500 tid prn 36

## 2016-11-25 NOTE — Telephone Encounter (Signed)
Parafon sent into pharmacy.

## 2016-11-25 NOTE — Telephone Encounter (Signed)
Per fax from [facility] 500 mg one po Q 8 hours prn for muscle spasms is on back order. Please advise.

## 2016-11-26 ENCOUNTER — Ambulatory Visit (HOSPITAL_COMMUNITY): Payer: BLUE CROSS/BLUE SHIELD | Admitting: Occupational Therapy

## 2016-11-26 ENCOUNTER — Encounter (HOSPITAL_COMMUNITY): Payer: Self-pay | Admitting: Occupational Therapy

## 2016-11-26 DIAGNOSIS — M542 Cervicalgia: Secondary | ICD-10-CM

## 2016-11-26 DIAGNOSIS — R29898 Other symptoms and signs involving the musculoskeletal system: Secondary | ICD-10-CM

## 2016-11-26 DIAGNOSIS — M25512 Pain in left shoulder: Secondary | ICD-10-CM

## 2016-11-26 DIAGNOSIS — M25612 Stiffness of left shoulder, not elsewhere classified: Secondary | ICD-10-CM
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
## 2016-11-30 ENCOUNTER — Ambulatory Visit (HOSPITAL_COMMUNITY): Payer: BLUE CROSS/BLUE SHIELD | Attending: Family Medicine | Admitting: Occupational Therapy

## 2016-11-30 ENCOUNTER — Ambulatory Visit: Payer: Self-pay | Admitting: Orthopaedic Surgery

## 2016-11-30 ENCOUNTER — Encounter (HOSPITAL_COMMUNITY): Payer: Self-pay | Admitting: Occupational Therapy

## 2016-11-30 DIAGNOSIS — R29898 Other symptoms and signs involving the musculoskeletal system: Secondary | ICD-10-CM

## 2016-11-30 DIAGNOSIS — M25612 Stiffness of left shoulder, not elsewhere classified: Secondary | ICD-10-CM | POA: Diagnosis present

## 2016-11-30 DIAGNOSIS — M25512 Pain in left shoulder: Secondary | ICD-10-CM

## 2016-11-30 DIAGNOSIS — M542 Cervicalgia: Secondary | ICD-10-CM | POA: Insufficient documentation
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
## 2016-12-03 ENCOUNTER — Encounter (HOSPITAL_COMMUNITY): Payer: Self-pay | Admitting: Occupational Therapy

## 2016-12-03 ENCOUNTER — Ambulatory Visit (HOSPITAL_COMMUNITY): Payer: BLUE CROSS/BLUE SHIELD | Admitting: Occupational Therapy

## 2016-12-03 DIAGNOSIS — M25512 Pain in left shoulder: Secondary | ICD-10-CM

## 2016-12-03 DIAGNOSIS — R29898 Other symptoms and signs involving the musculoskeletal system: Secondary | ICD-10-CM | POA: Diagnosis not present

## 2016-12-03 DIAGNOSIS — M25612 Stiffness of left shoulder, not elsewhere classified: Secondary | ICD-10-CM

## 2016-12-03 DIAGNOSIS — M542 Cervicalgia: Secondary | ICD-10-CM
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
## 2016-12-06 ENCOUNTER — Encounter (HOSPITAL_COMMUNITY): Payer: Self-pay | Admitting: Occupational Therapy

## 2016-12-06 ENCOUNTER — Ambulatory Visit (HOSPITAL_COMMUNITY): Payer: BLUE CROSS/BLUE SHIELD | Admitting: Occupational Therapy

## 2016-12-06 DIAGNOSIS — R29898 Other symptoms and signs involving the musculoskeletal system: Secondary | ICD-10-CM

## 2016-12-06 DIAGNOSIS — M25512 Pain in left shoulder: Secondary | ICD-10-CM

## 2016-12-06 DIAGNOSIS — M25612 Stiffness of left shoulder, not elsewhere classified: Secondary | ICD-10-CM

## 2016-12-06 NOTE — Patient Instructions (Signed)
ROM: Abduction (Standing)   Bring arms straight out from sides and raise as high as possible without pain. Repeat ____ times per set. Do ____ sets per session. Do ____ sessions per day.  http://orth.exer.us/910   Copyright  VHI. All rights reserved.   Extension (Active) ROM: Extension (Standing)   Bring arms straight back as far as possible without pain. Repeat ____ times per set. Do ____ sets per session. Do ____ sessions per day.  http://orth.exer.us/916   Copyright  VHI. All rights reserved.   ROM: External / Internal Rotation - in Abduction (Standing)   With upper arms parallel to floor and elbows bent at right angles, gently rotate arms up then down as far as possible without pain. Repeat ____ times per set. Do ____ sets per session. Do ____ sessions per day.  http://orth.exer.us/912   Copyright  VHI. All rights reserved.    Flexors Stretch (Active)   Stand, arms straight at sides. Bring arms straight forward and upward as high as possible without pain. Hold ___ seconds. Repeat ___ times per session. Do ___ sessions per day.  Copyright  VHI. All rights reserved.   Scapular Retraction (Standing)   With arms at sides, pinch shoulder blades together. Repeat ____ times per set. Do ____ sets per session. Do ____ sessions per day.  http://orth.exer.us/944   Copyright  VHI. All rights reserved.   
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
## 2016-12-07 ENCOUNTER — Ambulatory Visit (INDEPENDENT_AMBULATORY_CARE_PROVIDER_SITE_OTHER): Payer: Self-pay | Admitting: Orthopaedic Surgery

## 2016-12-07 ENCOUNTER — Encounter: Payer: Self-pay | Admitting: Orthopaedic Surgery

## 2016-12-07 VITALS — BP 139/87 | HR 70 | Temp 97.2°F | Ht 67.0 in | Wt 177.0 lb

## 2016-12-07 DIAGNOSIS — M542 Cervicalgia: Secondary | ICD-10-CM
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
## 2016-12-09 ENCOUNTER — Encounter (HOSPITAL_COMMUNITY): Payer: Self-pay | Admitting: Occupational Therapy

## 2016-12-09 ENCOUNTER — Ambulatory Visit (HOSPITAL_COMMUNITY): Payer: BLUE CROSS/BLUE SHIELD | Admitting: Occupational Therapy

## 2016-12-09 DIAGNOSIS — R29898 Other symptoms and signs involving the musculoskeletal system: Secondary | ICD-10-CM | POA: Diagnosis not present

## 2016-12-09 DIAGNOSIS — M25512 Pain in left shoulder: Secondary | ICD-10-CM

## 2016-12-09 DIAGNOSIS — M542 Cervicalgia: Secondary | ICD-10-CM

## 2016-12-09 DIAGNOSIS — M25612 Stiffness of left shoulder, not elsewhere classified: Secondary | ICD-10-CM
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
## 2016-12-13 ENCOUNTER — Telehealth (HOSPITAL_COMMUNITY): Payer: Self-pay

## 2016-12-13 NOTE — Telephone Encounter (Signed)
L/m offered 2 more apptmnts for this week/7/16 NF

## 2016-12-16 ENCOUNTER — Ambulatory Visit (HOSPITAL_COMMUNITY): Payer: BLUE CROSS/BLUE SHIELD

## 2016-12-16 ENCOUNTER — Encounter (HOSPITAL_COMMUNITY): Payer: Self-pay

## 2016-12-16 DIAGNOSIS — M542 Cervicalgia: Secondary | ICD-10-CM

## 2016-12-16 DIAGNOSIS — R29898 Other symptoms and signs involving the musculoskeletal system: Secondary | ICD-10-CM

## 2016-12-16 DIAGNOSIS — M25512 Pain in left shoulder: Secondary | ICD-10-CM

## 2016-12-16 DIAGNOSIS — M25612 Stiffness of left shoulder, not elsewhere classified: Secondary | ICD-10-CM
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
## 2016-12-16 NOTE — Patient Instructions (Signed)
Complete exercises 1 time a day. 10-15 repetitions.  PRONE  Y - FLEXION  Lying face down, slowly raise up your arms forward and overhead with elbows straight.    PRONE T - THUMB UP  Lie face down with your elbow straight and arm dangling down towards the floor. Next, set your scapula by retracting it towards your spine and downward towards your feet. Then, slowly raise your arm keeping your elbow straight the entire time as shown.  Your thumb should be pointed in the upward direction as your arm raises.      PRONE T  - PALM DOWN  Lie face down with your elbow straight and arm dangling down towards the floor. Next, set your scapula by retracting it towards your spine and downward towards your feet. Then, slowly raise your arm keeping your elbow straight the entire time as shown.  Your palm should be directed downward as your arm raises.     PRONE ROWS - 90  Lying face down with your elbows straight, slowly raise your arms upward while bending your elbows.   Your elbows should be approximately 90 degrees away from your side.     PRONE RETRACTION  Lying face down with your elbows straight, slowly draw your shoulder blade back towards your spine. Your whole arm should raise including your shoulder blade upward as shown. Your elbow should be straight the entire time.

## 2016-12-22 ENCOUNTER — Ambulatory Visit (HOSPITAL_COMMUNITY): Payer: BLUE CROSS/BLUE SHIELD

## 2017-01-03 ENCOUNTER — Encounter (HOSPITAL_COMMUNITY): Payer: Self-pay

## 2017-01-04 ENCOUNTER — Encounter: Payer: Self-pay | Admitting: Orthopaedic Surgery

## 2017-01-04 ENCOUNTER — Ambulatory Visit (INDEPENDENT_AMBULATORY_CARE_PROVIDER_SITE_OTHER): Payer: Self-pay | Admitting: Orthopaedic Surgery

## 2017-01-04 VITALS — BP 135/78 | HR 61 | Temp 97.4°F | Ht 67.0 in | Wt 174.0 lb

## 2017-01-04 DIAGNOSIS — M25512 Pain in left shoulder: Secondary | ICD-10-CM

## 2017-01-04 DIAGNOSIS — M542 Cervicalgia: Secondary | ICD-10-CM

## 2017-01-04 DIAGNOSIS — G8929 Other chronic pain: Secondary | ICD-10-CM
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
## 2017-01-05 ENCOUNTER — Encounter (HOSPITAL_COMMUNITY): Payer: Self-pay

## 2017-01-11 ENCOUNTER — Encounter (HOSPITAL_COMMUNITY): Payer: Self-pay

## 2017-01-13 ENCOUNTER — Encounter (HOSPITAL_COMMUNITY): Payer: Self-pay | Admitting: Occupational Therapy

## 2017-01-14 DIAGNOSIS — Z029 Encounter for administrative examinations, unspecified: Secondary | ICD-10-CM

## 2017-01-18 ENCOUNTER — Encounter (HOSPITAL_COMMUNITY): Payer: Self-pay

## 2017-02-01 ENCOUNTER — Encounter: Payer: Self-pay | Admitting: Orthopaedic Surgery

## 2017-02-01 ENCOUNTER — Other Ambulatory Visit: Payer: Self-pay | Admitting: Orthopedic Surgery

## 2017-02-01 ENCOUNTER — Ambulatory Visit (INDEPENDENT_AMBULATORY_CARE_PROVIDER_SITE_OTHER): Payer: Self-pay | Admitting: Orthopaedic Surgery

## 2017-02-01 VITALS — BP 127/85 | HR 58 | Temp 97.9°F | Ht 67.0 in | Wt 176.0 lb

## 2017-02-01 DIAGNOSIS — G8929 Other chronic pain: Secondary | ICD-10-CM

## 2017-02-01 DIAGNOSIS — M25512 Pain in left shoulder: Secondary | ICD-10-CM
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
## 2017-02-24 ENCOUNTER — Encounter (HOSPITAL_BASED_OUTPATIENT_CLINIC_OR_DEPARTMENT_OTHER): Payer: Self-pay | Admitting: *Deleted

## 2017-03-03 ENCOUNTER — Ambulatory Visit (HOSPITAL_BASED_OUTPATIENT_CLINIC_OR_DEPARTMENT_OTHER): Payer: BLUE CROSS/BLUE SHIELD | Admitting: Anesthesiology

## 2017-03-03 ENCOUNTER — Encounter (HOSPITAL_BASED_OUTPATIENT_CLINIC_OR_DEPARTMENT_OTHER)
Admission: RE | Disposition: A | Discharge status: Home / Self Care | Payer: Self-pay | Source: Ambulatory Visit | Attending: Orthopedic Surgery

## 2017-03-03 ENCOUNTER — Encounter (HOSPITAL_BASED_OUTPATIENT_CLINIC_OR_DEPARTMENT_OTHER): Payer: Self-pay | Admitting: *Deleted

## 2017-03-03 ENCOUNTER — Ambulatory Visit (HOSPITAL_BASED_OUTPATIENT_CLINIC_OR_DEPARTMENT_OTHER)
Admission: RE | Admit: 2017-03-03 | Discharge: 2017-03-03 | Disposition: A | Discharge status: Home / Self Care | Payer: BLUE CROSS/BLUE SHIELD | Source: Ambulatory Visit | Attending: Orthopedic Surgery | Admitting: Orthopedic Surgery

## 2017-03-03 DIAGNOSIS — Z8249 Family history of ischemic heart disease and other diseases of the circulatory system: Secondary | ICD-10-CM | POA: Diagnosis not present

## 2017-03-03 DIAGNOSIS — F329 Major depressive disorder, single episode, unspecified: Secondary | ICD-10-CM | POA: Diagnosis not present

## 2017-03-03 DIAGNOSIS — S63418D Traumatic rupture of collateral ligament of other finger at metacarpophalangeal and interphalangeal joint, subsequent encounter: Secondary | ICD-10-CM | POA: Insufficient documentation

## 2017-03-03 HISTORY — DX: Myoneural disorder, unspecified: G70.9

## 2017-03-03 HISTORY — PX: ULNAR COLLATERAL LIGAMENT REPAIR: SHX6159

## 2017-03-03 HISTORY — DX: Family history of other specified conditions: Z84.89

## 2017-03-03 SURGERY — REPAIR, LIGAMENT, ULNAR COLLATERAL
Anesthesia: General | Site: Thumb | Laterality: Right

## 2017-03-03 MED ORDER — CEFAZOLIN SODIUM-DEXTROSE 2-4 GM/100ML-% IV SOLN
INTRAVENOUS | Status: AC
  Filled 2017-03-03: qty 100

## 2017-03-03 MED ORDER — FENTANYL CITRATE (PF) 100 MCG/2ML IJ SOLN
INTRAMUSCULAR | Status: AC
  Filled 2017-03-03: qty 2

## 2017-03-03 MED ORDER — SCOPOLAMINE 1 MG/3DAYS TD PT72: 1.0000 | MEDICATED_PATCH | Freq: Once | TRANSDERMAL | Status: DC | PRN

## 2017-03-03 MED ORDER — MIDAZOLAM HCL 2 MG/2ML IJ SOLN
INTRAMUSCULAR | Status: AC
  Filled 2017-03-03: qty 2

## 2017-03-03 MED ORDER — DEXAMETHASONE SODIUM PHOSPHATE 10 MG/ML IJ SOLN
INTRAMUSCULAR | Status: AC
  Filled 2017-03-03: qty 1

## 2017-03-03 MED ORDER — EPHEDRINE SULFATE 50 MG/ML IJ SOLN
INTRAMUSCULAR | Status: DC | PRN
  Administered 2017-03-03: 10 mg via INTRAVENOUS

## 2017-03-03 MED ORDER — HYDROCODONE-ACETAMINOPHEN 5-325 MG PO TABS
1.0000 | ORAL_TABLET | Freq: Four times a day (QID) | ORAL | 0 refills | Status: DC | PRN
Start: 2017-03-03 — End: 2018-01-19

## 2017-03-03 MED ORDER — MIDAZOLAM HCL 2 MG/2ML IJ SOLN
1.0000 mg | INTRAMUSCULAR | Status: DC | PRN
  Administered 2017-03-03: 2 mg via INTRAVENOUS

## 2017-03-03 MED ORDER — LIDOCAINE 2% (20 MG/ML) 5 ML SYRINGE
INTRAMUSCULAR | Status: DC | PRN
  Administered 2017-03-03: 100 mg via INTRAVENOUS

## 2017-03-03 MED ORDER — PROPOFOL 10 MG/ML IV BOLUS
INTRAVENOUS | Status: AC
  Filled 2017-03-03: qty 20

## 2017-03-03 MED ORDER — PROMETHAZINE HCL 25 MG/ML IJ SOLN: 6.2500 mg | INTRAMUSCULAR | Status: DC | PRN

## 2017-03-03 MED ORDER — EPHEDRINE 5 MG/ML INJ
INTRAVENOUS | Status: AC
  Filled 2017-03-03: qty 10

## 2017-03-03 MED ORDER — ONDANSETRON HCL 4 MG/2ML IJ SOLN
INTRAMUSCULAR | Status: AC
  Filled 2017-03-03: qty 2

## 2017-03-03 MED ORDER — PROPOFOL 10 MG/ML IV BOLUS
INTRAVENOUS | Status: DC | PRN
  Administered 2017-03-03: 150 mg via INTRAVENOUS

## 2017-03-03 MED ORDER — DEXAMETHASONE SODIUM PHOSPHATE 10 MG/ML IJ SOLN
INTRAMUSCULAR | Status: DC | PRN
  Administered 2017-03-03: 10 mg via INTRAVENOUS

## 2017-03-03 MED ORDER — LACTATED RINGERS IV SOLN
INTRAVENOUS | Status: DC
  Administered 2017-03-03: 10:00:00 via INTRAVENOUS

## 2017-03-03 MED ORDER — FENTANYL CITRATE (PF) 100 MCG/2ML IJ SOLN
INTRAMUSCULAR | Status: DC | PRN
  Administered 2017-03-03: 50 ug via INTRAVENOUS

## 2017-03-03 MED ORDER — FENTANYL CITRATE (PF) 100 MCG/2ML IJ SOLN
50.0000 ug | INTRAMUSCULAR | Status: DC | PRN
  Administered 2017-03-03: 50 ug via INTRAVENOUS

## 2017-03-03 MED ORDER — CEFAZOLIN SODIUM-DEXTROSE 2-4 GM/100ML-% IV SOLN
2.0000 g | INTRAVENOUS | Status: AC
  Administered 2017-03-03: 2 g via INTRAVENOUS

## 2017-03-03 MED ORDER — FENTANYL CITRATE (PF) 100 MCG/2ML IJ SOLN: 25.0000 ug | INTRAMUSCULAR | Status: DC | PRN

## 2017-03-03 MED ORDER — LIDOCAINE 2% (20 MG/ML) 5 ML SYRINGE
INTRAMUSCULAR | Status: AC
  Filled 2017-03-03: qty 5

## 2017-03-03 MED ORDER — ONDANSETRON HCL 4 MG/2ML IJ SOLN
INTRAMUSCULAR | Status: DC | PRN
  Administered 2017-03-03: 4 mg via INTRAVENOUS

## 2017-03-03 MED ORDER — ROPIVACAINE HCL 7.5 MG/ML IJ SOLN
INTRAMUSCULAR | Status: DC | PRN
  Administered 2017-03-03: 20 mL via PERINEURAL

## 2017-03-03 MED ORDER — CHLORHEXIDINE GLUCONATE 4 % EX LIQD: 60.0000 mL | Freq: Once | CUTANEOUS | Status: DC

## 2017-03-03 SURGICAL SUPPLY — 68 items
ANCHOR JUGGERKNOT 1.0 3-0 NLD (Anchor) ×3 IMPLANT
BAG DECANTER FOR FLEXI CONT (MISCELLANEOUS) IMPLANT
BLADE MINI RND TIP GREEN BEAV (BLADE) ×3 IMPLANT
BLADE SURG 15 STRL LF DISP TIS (BLADE) ×1 IMPLANT
BLADE SURG 15 STRL SS (BLADE) ×2
BNDG COHESIVE 3X5 TAN STRL LF (GAUZE/BANDAGES/DRESSINGS) ×3 IMPLANT
BNDG ESMARK 4X9 LF (GAUZE/BANDAGES/DRESSINGS) ×3 IMPLANT
BNDG GAUZE ELAST 4 BULKY (GAUZE/BANDAGES/DRESSINGS) ×3 IMPLANT
CHLORAPREP W/TINT 26ML (MISCELLANEOUS) ×3 IMPLANT
CORD BIPOLAR FORCEPS 12FT (ELECTRODE) ×3 IMPLANT
COVER BACK TABLE 60X90IN (DRAPES) ×3 IMPLANT
COVER MAYO STAND STRL (DRAPES) ×3 IMPLANT
CUFF TOURNIQUET SINGLE 18IN (TOURNIQUET CUFF) ×3 IMPLANT
DECANTER SPIKE VIAL GLASS SM (MISCELLANEOUS) IMPLANT
DRAPE EXTREMITY T 121X128X90 (DRAPE) ×3 IMPLANT
DRAPE OEC MINIVIEW 54X84 (DRAPES) ×3 IMPLANT
DRAPE SURG 17X23 STRL (DRAPES) ×3 IMPLANT
GAUZE SPONGE 4X4 12PLY STRL (GAUZE/BANDAGES/DRESSINGS) ×3 IMPLANT
GAUZE XEROFORM 1X8 LF (GAUZE/BANDAGES/DRESSINGS) ×3 IMPLANT
GLOVE BIO SURGEON STRL SZ7.5 (GLOVE) ×3 IMPLANT
GLOVE BIOGEL PI IND STRL 7.0 (GLOVE) ×2 IMPLANT
GLOVE BIOGEL PI IND STRL 8 (GLOVE) ×1 IMPLANT
GLOVE BIOGEL PI IND STRL 8.5 (GLOVE) ×1 IMPLANT
GLOVE BIOGEL PI INDICATOR 7.0 (GLOVE) ×4
GLOVE BIOGEL PI INDICATOR 8 (GLOVE) ×2
GLOVE BIOGEL PI INDICATOR 8.5 (GLOVE) ×2
GLOVE ECLIPSE 6.5 STRL STRAW (GLOVE) ×3 IMPLANT
GLOVE SURG ORTHO 8.0 STRL STRW (GLOVE) ×3 IMPLANT
GOWN STRL REUS W/ TWL LRG LVL3 (GOWN DISPOSABLE) ×1 IMPLANT
GOWN STRL REUS W/TWL LRG LVL3 (GOWN DISPOSABLE) ×2
GOWN STRL REUS W/TWL XL LVL3 (GOWN DISPOSABLE) ×6 IMPLANT
K-WIRE .035X4 (WIRE) IMPLANT
NDL SAFETY ECLIPSE 18X1.5 (NEEDLE) IMPLANT
NEEDLE FISTULA 1/2 CIRCLE (NEEDLE) IMPLANT
NEEDLE HYPO 18GX1.5 SHARP (NEEDLE)
NEEDLE KEITH (NEEDLE) IMPLANT
NEEDLE KEITH SZ10 STRAIGHT (NEEDLE) IMPLANT
NEEDLE PRECISIONGLIDE 27X1.5 (NEEDLE) IMPLANT
NS IRRIG 1000ML POUR BTL (IV SOLUTION) ×3 IMPLANT
PACK BASIN DAY SURGERY FS (CUSTOM PROCEDURE TRAY) ×3 IMPLANT
PAD CAST 3X4 CTTN HI CHSV (CAST SUPPLIES) ×1 IMPLANT
PAD CAST 4YDX4 CTTN HI CHSV (CAST SUPPLIES) IMPLANT
PADDING CAST ABS 4INX4YD NS (CAST SUPPLIES)
PADDING CAST ABS COTTON 4X4 ST (CAST SUPPLIES) IMPLANT
PADDING CAST COTTON 3X4 STRL (CAST SUPPLIES) ×2
PADDING CAST COTTON 4X4 STRL (CAST SUPPLIES)
PASSER SUT SWANSON 36MM LOOP (INSTRUMENTS) IMPLANT
SLEEVE SCD COMPRESS KNEE MED (MISCELLANEOUS) ×3 IMPLANT
SLING ARM FOAM STRAP LRG (SOFTGOODS) ×3 IMPLANT
SPLINT PLASTER CAST XFAST 3X15 (CAST SUPPLIES) ×10 IMPLANT
SPLINT PLASTER XTRA FASTSET 3X (CAST SUPPLIES) ×20
STOCKINETTE 4X48 STRL (DRAPES) ×3 IMPLANT
SUT ETHIBOND 3-0 V-5 (SUTURE) IMPLANT
SUT ETHILON 4 0 PS 2 18 (SUTURE) ×3 IMPLANT
SUT FIBERWIRE 2-0 18 17.9 3/8 (SUTURE)
SUT FIBERWIRE 3-0 18 TAPR NDL (SUTURE)
SUT MERSILENE 2.0 SH NDLE (SUTURE) IMPLANT
SUT MERSILENE 4 0 P 3 (SUTURE) ×3 IMPLANT
SUT SILK 4 0 PS 2 (SUTURE) IMPLANT
SUT STEEL 3 0 (SUTURE) IMPLANT
SUT STEEL 4 0 (SUTURE) IMPLANT
SUT VICRYL 4-0 PS2 18IN ABS (SUTURE) IMPLANT
SUTURE FIBERWR 2-0 18 17.9 3/8 (SUTURE) IMPLANT
SUTURE FIBERWR 3-0 18 TAPR NDL (SUTURE) IMPLANT
SYR BULB 3OZ (MISCELLANEOUS) ×3 IMPLANT
SYR CONTROL 10ML LL (SYRINGE) IMPLANT
TOWEL OR 17X24 6PK STRL BLUE (TOWEL DISPOSABLE) ×6 IMPLANT
UNDERPAD 30X30 (UNDERPADS AND DIAPERS) IMPLANT

## 2017-03-03 NOTE — Discharge Instructions (Addendum)
[facility] Instructions Hand Surgery  Wound Care: Keep your hand elevated above the level of your heart.  Do not allow it to dangle by your side.  Keep the dressing dry and do not remove it unless your doctor advises you to do so.  He will usually change it at the time of your post-op visit.  Moving your fingers is advised to stimulate circulation but will depend on the site of your surgery.  If you have a splint applied, your doctor will advise you regarding movement.  Activity: Do not drive or operate machinery today.  Rest today and then you may return to your normal activity and work as indicated by your physician.  Diet:  Drink liquids today or eat a light diet.  You may resume a regular diet tomorrow.    General expectations: Pain for two to three days. Fingers may become slightly swollen.  Call your doctor if any of the following occur: Severe pain not relieved by pain medication. Elevated temperature. Dressing soaked with blood. Inability to move fingers. White or bluish color to fingers.     Regional Anesthesia Blocks  1. Numbness or the inability to move the "blocked" extremity may last from 3-48 hours after placement. The length of time depends on the medication injected and your individual response to the medication. If the numbness is not going away after 48 hours, call your surgeon.  2. The extremity that is blocked will need to be protected until the numbness is gone and the  Strength has returned. Because you cannot feel it, you will need to take extra care to avoid injury. Because it may be weak, you may have difficulty moving it or using it. You may not know what position it is in without looking at it while the block is in effect.  3. For blocks in the legs and feet, returning to weight bearing and walking needs to be done carefully. You will need to wait until the numbness is entirely gone and the strength has returned. You should be able to move your leg and foot  normally before you try and bear weight or walk. You will need someone to be with you when you first try to ensure you do not fall and possibly risk injury.  4. Bruising and tenderness at the needle site are common side effects and will resolve in a few days.       Post Anesthesia Home Care Instructions  Activity: Get plenty of rest for the remainder of the day. A responsible individual must stay with you for 24 hours following the procedure.  For the next 24 hours, DO NOT: -Drive a car -Paediatric nurse -Drink alcoholic beverages -Take any medication unless instructed by your physician -Make any legal decisions or sign important papers.  Meals: Start with liquid foods such as gelatin or soup. Progress to regular foods as tolerated. Avoid greasy, spicy, heavy foods. If nausea and/or vomiting occur, drink only clear liquids until the nausea and/or vomiting subsides. Call your physician if vomiting continues.  Special Instructions/Symptoms: Your throat may feel dry or sore from the anesthesia or the breathing tube placed in your throat during surgery. If this causes discomfort, gargle with warm salt water. The discomfort should disappear within 24 hours.  If you had a scopolamine patch placed behind your ear for the management of post- operative nausea and/or vomiting:  1. The medication in the patch is effective for 72 hours, after which it should be removed.  Wrap  patch in a tissue and discard in the trash. Wash hands thoroughly with soap and water. 2. You may remove the patch earlier than 72 hours if you experience unpleasant side effects which may include dry mouth, dizziness or visual disturbances. 3. Avoid touching the patch. Wash your hands with soap and water after contact with the patch.    5. Persistent numbness or new problems with movement should be communicated to the surgeon or the [place] [phone] Wanda [phone]).

## 2017-03-03 NOTE — Op Note (Signed)
I assisted Surgeon(s) and Role:    * Daryll Brod, MD - Primary    * Leanora Cover, MD - Assisting on the Procedure(s): REPAIR/RECONSTRUCTION RIGHT THUMB [place] on 03/03/2017.  I provided assistance on this case as follows: retraction soft tissues, positioning thumb.  Electronically signed by: Tennis Must, MD Date: 03/03/2017 Time: 11:14 AM
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
## 2017-03-03 NOTE — Op Note (Signed)
Dictation Number [id number]

## 2017-03-03 NOTE — Anesthesia Procedure Notes (Signed)
Procedure Name: LMA Insertion Date/Time: 03/03/2017 10:31 AM Performed by: Genelle Bal Pre-anesthesia Checklist: Patient identified, Emergency Drugs available, Suction available and Patient being monitored Patient Re-evaluated:Patient Re-evaluated prior to induction Oxygen Delivery Method: Circle system utilized Preoxygenation: Pre-oxygenation with 100% oxygen Induction Type: IV induction Ventilation: Mask ventilation without difficulty LMA: LMA inserted LMA Size: 4.0 Number of attempts: 1 Airway Equipment and Method: Bite block Placement Confirmation: positive ETCO2 Tube secured with: Tape Dental Injury: Teeth and Oropharynx as per pre-operative assessment

## 2017-03-03 NOTE — Brief Op Note (Signed)
03/03/2017  11:18 AM  PATIENT:  Holly Little  51 y.o. female  PRE-OPERATIVE DIAGNOSIS:  RIGHT THUMB COLLATERAL LIGAMENT ULNAR ASPECT METACARPAL JOINT  POST-OPERATIVE DIAGNOSIS:  RIGHT THUMB COLLATERAL LIGAMENT ULNAR ASPECT METACARPAL JOINT  PROCEDURE:  Procedure(s) with comments: REPAIR/RECONSTRUCTION RIGHT THUMB ULNAR COLLATERAL LIGAMENT METACARPAL JOINT (Right) - AXILLARY BLOCK  SURGEON:  Surgeon(s) and Role:    * Daryll Brod, MD - Primary    * Leanora Cover, MD - Assisting  PHYSICIAN ASSISTANT:   ASSISTANTS: K Jasey Cortez,MD   ANESTHESIA:   regional and IV sedation  EBL:  Total I/O In: -  Out: 10 [Blood:10]  BLOOD ADMINISTERED:none  DRAINS: none   LOCAL MEDICATIONS USED:  NONE  SPECIMEN:  No Specimen  DISPOSITION OF SPECIMEN:  N/A  COUNTS:  correct  TOURNIQUET:   Total Tourniquet Time Documented: Upper Arm (Right) - 28 minutes Total: Upper Arm (Right) - 28 minutes   DICTATION: .Other Dictation: Dictation Number [id number]  PLAN OF CARE: Discharge to home after PACU  PATIENT DISPOSITION:  PACU - hemodynamically stable.

## 2017-03-03 NOTE — Anesthesia Preprocedure Evaluation (Signed)
Anesthesia Evaluation  Patient identified by MRN, date of birth, ID band Patient awake    Reviewed: Allergy & Precautions, NPO status , Patient's Chart, lab work & pertinent test results  History of Anesthesia Complications (+) Family history of anesthesia reaction and history of anesthetic complications (Mother with cardiac arrest x2)  Airway Mallampati: II  TM Distance: >3 FB Neck ROM: Full    Dental  (+) Teeth Intact, Dental Advisory Given   Pulmonary neg pulmonary ROS,    Pulmonary exam normal breath sounds clear to auscultation       Cardiovascular negative cardio ROS Normal cardiovascular exam Rhythm:Regular Rate:Normal     Neuro/Psych  Headaches, PSYCHIATRIC DISORDERS Depression    GI/Hepatic negative GI ROS, Neg liver ROS,   Endo/Other  negative endocrine ROS  Renal/GU negative Renal ROS     Musculoskeletal negative musculoskeletal ROS (+)   Abdominal   Peds  Hematology  (+) Blood dyscrasia, anemia ,   Anesthesia Other Findings Day of surgery medications reviewed with the patient.  Reproductive/Obstetrics                             Anesthesia Physical Anesthesia Plan  ASA: II  Anesthesia Plan: General   Post-op Pain Management:  Regional for Post-op pain   Induction: Intravenous  PONV Risk Score and Plan: 3 and Ondansetron, Dexamethasone and Midazolam  Airway Management Planned: LMA  Additional Equipment:   Intra-op Plan:   Post-operative Plan: Extubation in OR  Informed Consent: I have reviewed the patients History and Physical, chart, labs and discussed the procedure including the risks, benefits and alternatives for the proposed anesthesia with the patient or authorized representative who has indicated his/her understanding and acceptance.   Dental advisory given  Plan Discussed with: CRNA  Anesthesia Plan Comments: (Risks/benefits of general anesthesia  discussed with patient including risk of damage to teeth, lips, gum, and tongue, nausea/vomiting, allergic reactions to medications, and the possibility of heart attack, stroke and death.  All patient questions answered.  Patient wishes to proceed.)        Anesthesia Quick Evaluation
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
## 2017-03-03 NOTE — Transfer of Care (Signed)
Immediate Anesthesia Transfer of Care Note  Patient: Holly Little  Procedure(s) Performed: REPAIR/RECONSTRUCTION RIGHT THUMB ULNAR COLLATERAL LIGAMENT METACARPAL JOINT (Right Thumb)  Patient Location: PACU  Anesthesia Type:General  Level of Consciousness: awake  Airway & Oxygen Therapy: Patient Spontanous Breathing and Patient connected to face mask oxygen  Post-op Assessment: Report given to RN and Post -op Vital signs reviewed and stable  Post vital signs: Reviewed and stable  Last Vitals:  Vitals:   03/03/17 1015 03/03/17 1125  BP: 120/81   Pulse: (!) 53 77  Resp: 11 (!) 22  Temp:    SpO2: 100% 100%    Last Pain:  Vitals:   03/03/17 0945  TempSrc: Oral  PainSc: 4       Patients Stated Pain Goal: 2 (12/18/80 8833)  Complications: No apparent anesthesia complications

## 2017-03-03 NOTE — Progress Notes (Signed)
Assisted Dr. Turk with right, ultrasound guided, supraclavicular block. Side rails up, monitors on throughout procedure. See vital signs in flow sheet. Tolerated Procedure well. 

## 2017-03-03 NOTE — Anesthesia Postprocedure Evaluation (Signed)
Anesthesia Post Note  Patient: Holly Little  Procedure(s) Performed: REPAIR/RECONSTRUCTION RIGHT THUMB ULNAR COLLATERAL LIGAMENT METACARPAL JOINT (Right Thumb)     Patient location during evaluation: PACU Anesthesia Type: General Level of consciousness: awake, awake and alert and oriented Pain management: pain level controlled Vital Signs Assessment: post-procedure vital signs reviewed and stable Respiratory status: spontaneous breathing, nonlabored ventilation and respiratory function stable Cardiovascular status: stable Postop Assessment: no headache and no signs of nausea or vomiting Anesthetic complications: no    Last Vitals:  Vitals:   03/03/17 1200 03/03/17 1225  BP: (!) 135/103 139/88  Pulse: 75 62  Resp: 15 16  Temp:  36.5 C  SpO2: 99% 99%    Last Pain:  Vitals:   03/03/17 1225  TempSrc:   PainSc: 0-No pain                 Catalina Gravel

## 2017-03-03 NOTE — Anesthesia Procedure Notes (Signed)
Anesthesia Regional Block: Supraclavicular block   Pre-Anesthetic Checklist: ,, timeout performed, Correct Patient, Correct Site, Correct Laterality, Correct Procedure, Correct Position, site marked, Risks and benefits discussed,  Surgical consent,  Pre-op evaluation,  At surgeon's request and post-op pain management  Laterality: Right  Prep: chloraprep       Needles:  Injection technique: Single-shot  Needle Type: Echogenic Needle     Needle Length: 9cm  Needle Gauge: 21     Additional Needles:   Procedures:,,,, ultrasound used (permanent image in chart),,,,  Narrative:  Start time: 03/03/2017 10:01 AM End time: 03/03/2017 10:06 AM Injection made incrementally with aspirations every 5 mL.  Performed by: Personally  Anesthesiologist: Catalina Gravel  Additional Notes: No pain on injection. No increased resistance to injection. Injection made in 5cc increments.  Good needle visualization.  Patient tolerated procedure well.

## 2017-03-04 ENCOUNTER — Encounter (HOSPITAL_BASED_OUTPATIENT_CLINIC_OR_DEPARTMENT_OTHER): Payer: Self-pay | Admitting: Orthopedic Surgery

## 2017-05-23 ENCOUNTER — Encounter: Payer: Self-pay | Admitting: Family Medicine

## 2018-01-19 ENCOUNTER — Telehealth: Payer: Self-pay | Admitting: *Deleted

## 2018-01-19 ENCOUNTER — Ambulatory Visit (INDEPENDENT_AMBULATORY_CARE_PROVIDER_SITE_OTHER): Payer: Medicaid Other | Admitting: Family Medicine

## 2018-01-19 ENCOUNTER — Encounter: Payer: Self-pay | Admitting: Family Medicine

## 2018-01-19 VITALS — BP 130/88 | Ht 66.5 in | Wt 182.0 lb

## 2018-01-19 DIAGNOSIS — Z1211 Encounter for screening for malignant neoplasm of colon: Secondary | ICD-10-CM

## 2018-01-19 DIAGNOSIS — Z1231 Encounter for screening mammogram for malignant neoplasm of breast: Secondary | ICD-10-CM

## 2018-01-19 DIAGNOSIS — Z0000 Encounter for general adult medical examination without abnormal findings: Secondary | ICD-10-CM

## 2018-01-19 DIAGNOSIS — Z124 Encounter for screening for malignant neoplasm of cervix: Secondary | ICD-10-CM | POA: Diagnosis not present

## 2018-01-19 MED ORDER — TRAMADOL HCL 50 MG PO TABS: 50.0000 mg | ORAL_TABLET | Freq: Four times a day (QID) | ORAL | 0 refills | Status: DC | PRN

## 2018-01-19 NOTE — Telephone Encounter (Signed)
Left message with pt to return call to let her know mammogram is scheduled at [place] sept 3rd register 9am.

## 2018-01-19 NOTE — Progress Notes (Signed)
   Subjective:    Patient ID: Holly Little, female    DOB: [birth year], 52 y.o.   MRN: [id number]  HPI The patient comes in today for a wellness visit.    A review of their health history was completed.  A review of medications was also completed.  Any needed refills; tramadol and chlorzoxazone for lower back pain. Also requesting hydrocodone it was last prescribed by surgeon.   Eating habits: health conccious  Falls/  MVA accidents in past few months: none  Regular exercise: walking, handweights  Specialist pt sees on regular basis: none  Preventative health issues were discussed.   Additional concerns: refilling meds for lower back pain.   gmo had br caner  Last mammo [id number]   Reg exrcise has a treadmill plus walks   Diet overall is good   Does stretching and strengthening eercise of the back  Flares u with weather changes and such     Review of Systems  Constitutional: Negative for activity change, appetite change and fatigue.  HENT: Negative for congestion and rhinorrhea.   Eyes: Negative for discharge.  Respiratory: Negative for cough, chest tightness and wheezing.   Cardiovascular: Negative for chest pain.  Gastrointestinal: Negative for abdominal pain, blood in stool and vomiting.  Endocrine: Negative for polyphagia.  Genitourinary: Negative for difficulty urinating and frequency.  Musculoskeletal: Negative for neck pain.  Skin: Negative for color change.  Allergic/Immunologic: Negative for environmental allergies and food allergies.  Neurological: Negative for weakness and headaches.  Psychiatric/Behavioral: Negative for agitation and behavioral problems.  All other systems reviewed and are negative.      Objective:   Physical Exam  Constitutional: She is oriented to person, place, and time. She appears well-developed and well-nourished.  HENT:  Head: Normocephalic.  Right Ear: External ear normal.  Left Ear: External ear normal.  Eyes:  Pupils are equal, round, and reactive to light.  Neck: Normal range of motion. No thyromegaly present.  Cardiovascular: Normal rate, regular rhythm, normal heart sounds and intact distal pulses.  No murmur heard. Pulmonary/Chest: Effort normal and breath sounds normal. No respiratory distress. She has no wheezes.  Abdominal: Soft. Bowel sounds are normal. She exhibits no distension and no mass. There is no tenderness.  Musculoskeletal: Normal range of motion. She exhibits no edema or tenderness.  Lymphadenopathy:    She has no cervical adenopathy.  Neurological: She is alert and oriented to person, place, and time. She exhibits normal muscle tone.  Skin: Skin is warm and dry.  Psychiatric: She has a normal mood and affect. Her behavior is normal.  Vitals reviewed.         Assessment & Plan:  Impression wellness exam.  Diet discussed.  Exercise discussed.  Appropriate blood work.  Patient weight on preventive interventions.  We will help her schedule colonoscopy and mammogram.  Pap smear obtained

## 2018-01-20 ENCOUNTER — Other Ambulatory Visit (HOSPITAL_COMMUNITY)
Admission: RE | Admit: 2018-01-20 | Discharge: 2018-01-20 | Disposition: A | Discharge status: Home / Self Care | Payer: Medicaid Other | Source: Ambulatory Visit | Attending: Family Medicine | Admitting: Family Medicine

## 2018-01-20 DIAGNOSIS — Z0000 Encounter for general adult medical examination without abnormal findings: Secondary | ICD-10-CM | POA: Insufficient documentation

## 2018-01-20 LAB — BASIC METABOLIC PANEL
Anion gap: 6 (ref 5–15)
BUN: 14 mg/dL (ref 6–20)
CALCIUM: 9.2 mg/dL (ref 8.9–10.3)
CO2: 29 mmol/L (ref 22–32)
Chloride: 105 mmol/L (ref 98–111)
Creatinine, Ser: 0.77 mg/dL (ref 0.44–1.00)
GFR calc non Af Amer: >60 mL/min (ref >60)
GLUCOSE: 93 mg/dL (ref 70–99)
Potassium: 4.3 mmol/L (ref 3.5–5.1)
Sodium: 140 mmol/L (ref 135–145)

## 2018-01-20 LAB — HEPATIC FUNCTION PANEL
ALT: 21 U/L (ref 0–44)
AST: 21 U/L (ref 15–41)
Albumin: 4 g/dL (ref 3.5–5.0)
Alkaline Phosphatase: 55 U/L (ref 38–126)
Bilirubin, Direct: <0.1 mg/dL (ref 0.0–0.2)
TOTAL PROTEIN: 7.7 g/dL (ref 6.5–8.1)
Total Bilirubin: 0.6 mg/dL (ref 0.3–1.2)

## 2018-01-20 LAB — LIPID PANEL
Cholesterol: 220 mg/dL — ABNORMAL HIGH (ref 0–200)
HDL: 39 mg/dL — ABNORMAL LOW (ref >40)
LDL CALC: 162 mg/dL — AB (ref 0–99)
Total CHOL/HDL Ratio: 5.6 RATIO
Triglycerides: 96 mg/dL (ref <150)
VLDL: 19 mg/dL (ref 0–40)

## 2018-01-20 NOTE — Telephone Encounter (Signed)
Pt states that Holly Little contacted her and stated that they do not do mammograms on the 3rd and changed her apt to the 4th at 9:15am.

## 2018-01-24 ENCOUNTER — Encounter: Payer: Self-pay | Admitting: Family Medicine

## 2018-01-24 LAB — PAP IG W/ RFLX HPV ASCU: PAP Smear Comment: 0

## 2018-01-31 ENCOUNTER — Ambulatory Visit (HOSPITAL_COMMUNITY): Payer: Self-pay

## 2018-02-01 ENCOUNTER — Ambulatory Visit (HOSPITAL_COMMUNITY)
Admission: RE | Admit: 2018-02-01 | Discharge: 2018-02-01 | Disposition: A | Discharge status: Home / Self Care | Payer: Medicaid Other | Source: Ambulatory Visit | Attending: Family Medicine | Admitting: Family Medicine

## 2018-02-01 DIAGNOSIS — Z1231 Encounter for screening mammogram for malignant neoplasm of breast: Secondary | ICD-10-CM | POA: Diagnosis not present

## 2018-02-09 ENCOUNTER — Ambulatory Visit (INDEPENDENT_AMBULATORY_CARE_PROVIDER_SITE_OTHER): Payer: Self-pay

## 2018-02-09 DIAGNOSIS — Z1211 Encounter for screening for malignant neoplasm of colon: Secondary | ICD-10-CM

## 2018-02-09 MED ORDER — NA SULFATE-K SULFATE-MG SULF 17.5-3.13-1.6 GM/177ML PO SOLN: 1.0000 | ORAL | 0 refills | Status: DC

## 2018-02-09 NOTE — Progress Notes (Signed)
Ok to schedule with 12.5 mg preprocedure Phenergan

## 2018-02-09 NOTE — Patient Instructions (Signed)
Holly Little  1965-06-17 MRN: [id number]     Procedure Date: 04/17/18 Time to register: 9:45am Place to register: [place] [facility] Procedure Time: 10:45am Scheduled provider: Barney Drain, MD    PREPARATION FOR COLONOSCOPY WITH SUPREP BOWEL PREP KIT  Note: Suprep Bowel Prep Kit is a split-dose (2day) regimen. Consumption of BOTH 6-ounce bottles is required for a complete prep.  Please notify us immediately if you are diabetic, take iron supplements, or if you are on Coumadin or any other blood thinners.                                                                                                                                                  1 DAY BEFORE PROCEDURE:  DATE: 04/16/18   DAY: Sunday  clear liquids the entire day - NO SOLID FOOD.    At 6:00pm: Complete steps 1 through 4 below, using ONE (1) 6-ounce bottle, before going to bed. Step 1:  Pour ONE (1) 6-ounce bottle of SUPREP liquid into the mixing container.  Step 2:  Add cool drinking water to the 16 ounce line on the container and mix.  Note: Dilute the solution concentrate as directed prior to use. Step 3:  DRINK ALL the liquid in the container. Step 4:  You MUST drink an additional two (2) or more 16 ounce containers of water over the next one (1) hour.   Continue clear liquids.  DAY OF PROCEDURE:   DATE: 04/17/18  DAY: Monday If you take medications for your heart, blood pressure, or breathing, you may take these medications.   5 hours before your procedure at :5:45am Step 1:  Pour ONE (1) 6-ounce bottle of SUPREP liquid into the mixing container.  Step 2:  Add cool drinking water to the 16 ounce line on the container and mix.  Note: Dilute the solution concentrate as directed prior to use. Step 3:  DRINK ALL the liquid in the container. Step 4:  You MUST drink an additional two (2) or more 16 ounce containers of water over the next one (1) hour. You MUST complete the final glass of water at  least 3 hours before your colonoscopy.   Nothing by mouth past 7:45am  You may take your morning medications with sip of water unless we have instructed otherwise.    Please see below for Dietary Information.  CLEAR LIQUIDS INCLUDE:  Water Jello (NOT red in color)   Ice Popsicles (NOT red in color)   Tea (sugar ok, no milk/cream) Powdered fruit flavored drinks  Coffee (sugar ok, no milk/cream) Gatorade/ Lemonade/ Kool-Aid  (NOT red in color)   Juice: apple, white grape, white cranberry Soft drinks  Clear bullion, consomme, broth (fat free beef/chicken/vegetable)  Carbonated beverages (any kind)  Strained chicken noodle soup Hard Candy   Remember: Clear liquids are liquids that  will allow you to see your fingers on the other side of a clear glass. Be sure liquids are NOT red in color, and not cloudy, but CLEAR.  DO NOT EAT OR DRINK ANY OF THE FOLLOWING:  Dairy products of any kind   Cranberry juice Tomato juice / V8 juice   Grapefruit juice Orange juice     Red grape juice  Do not eat any solid foods, including such foods as: cereal, oatmeal, yogurt, fruits, vegetables, creamed soups, eggs, bread, crackers, pureed foods in a blender, etc.   HELPFUL HINTS FOR DRINKING PREP SOLUTION:   Make sure prep is extremely cold. Mix and refrigerate the the morning of the prep. You may also put in the freezer.   You may try mixing some Crystal Light or Country Time Lemonade if you prefer. Mix in small amounts; add more if necessary.  Try drinking through a straw  Rinse mouth with water or a mouthwash between glasses, to remove after-taste.  Try sipping on a cold beverage /ice/ popsicles between glasses of prep.  Place a piece of sugar-free hard candy in mouth between glasses.  If you become nauseated, try consuming smaller amounts, or stretch out the time between glasses. Stop for 30-60 minutes, then slowly start back drinking.     OTHER INSTRUCTIONS  You will need a responsible  adult at least 54 years of age to accompany you and drive you home. This person must remain in the waiting room during your procedure. The hospital will cancel your procedure if you do not have a responsible adult with you.   1. Wear loose fitting clothing that is easily removed. 2. Leave jewelry and other valuables at home.  3. Remove all body piercing jewelry and leave at home. 4. Total time from sign-in until discharge is approximately 2-3 hours. 5. You should go home directly after your procedure and rest. You can resume normal activities the day after your procedure. 6. The day of your procedure you should not:  Drive  Make legal decisions  Operate machinery  Drink alcohol  Return to work   You may call the office (Dept: [phone]) before 5:00pm, or page the doctor on call [phone]) after 5:00pm, for further instructions, if necessary.   Insurance Information YOU WILL NEED TO CHECK WITH YOUR INSURANCE COMPANY FOR THE BENEFITS OF COVERAGE YOU HAVE FOR THIS PROCEDURE.  UNFORTUNATELY, NOT ALL INSURANCE COMPANIES HAVE BENEFITS TO COVER ALL OR PART OF THESE TYPES OF PROCEDURES.  IT IS YOUR RESPONSIBILITY TO CHECK YOUR BENEFITS, HOWEVER, WE WILL BE GLAD TO ASSIST YOU WITH ANY CODES YOUR INSURANCE COMPANY MAY NEED.    PLEASE NOTE THAT MOST INSURANCE COMPANIES WILL NOT COVER A SCREENING COLONOSCOPY FOR PEOPLE UNDER THE AGE OF 50  IF YOU HAVE BCBS INSURANCE, YOU MAY HAVE BENEFITS FOR A SCREENING COLONOSCOPY BUT IF POLYPS ARE FOUND THE DIAGNOSIS WILL CHANGE AND THEN YOU MAY HAVE A DEDUCTIBLE THAT WILL NEED TO BE MET. SO PLEASE MAKE SURE YOU CHECK YOUR BENEFITS FOR A SCREENING COLONOSCOPY AS WELL AS A DIAGNOSTIC COLONOSCOPY.

## 2018-02-09 NOTE — Progress Notes (Signed)
Gastroenterology Pre-Procedure Review  Request Date:02/09/18 Requesting Physician: Dr.Luking- last tcs [facility] [birth year] hyperplastic polyp +[facility] father  PATIENT REVIEW QUESTIONS: The patient responded to the following health history questions as indicated:    1. Diabetes Melitis: no 2. Joint replacements in the past 12 months: no 3. Major health problems in the past 3 months: no 4. Has an artificial valve or MVP: no 5. Has a defibrillator: no 6. Has been advised in past to take antibiotics in advance of a procedure like teeth cleaning: no 7. Family history of colon cancer: yes (father- age 54)  45. Alcohol Use: no 9. History of sleep apnea: no  10. History of coronary artery or other vascular stents placed within the last 12 months: no 11. History of any prior anesthesia complications: no    MEDICATIONS & ALLERGIES:    Patient reports the following regarding taking any blood thinners:   Plavix? no Aspirin? no Coumadin? no Brilinta? no Xarelto? no Eliquis? no Pradaxa? no Savaysa? no Effient? no  Patient confirms/reports the following medications:  Current Outpatient Medications  Medication Sig Dispense Refill  . acetaminophen (TYLENOL) 500 MG tablet Take 500 mg by mouth every 6 (six) hours as needed.    . Multiple Vitamins-Minerals (MULTIVITAMIN WITH MINERALS) tablet Take 1 tablet by mouth daily.    Marland Kitchen OVER THE COUNTER MEDICATION Biotin 5000 mcg Vit E 200 Super b complex Apple cider vinegar b12 1000mg  d3 1,000 iu    . traMADol (ULTRAM) 50 MG tablet Take 1 tablet (50 mg total) by mouth every 6 (six) hours as needed. 36 tablet 0   No current facility-administered medications for this visit.     Patient confirms/reports the following allergies:  No Known Allergies  No orders of the defined types were placed in this encounter.   AUTHORIZATION INFORMATION Primary Insurance: [employer] med[employer]  ID #: [id number] t Pre-Cert / Josem Kaufmann required: no   SCHEDULE INFORMATION: Procedure has  been scheduled as follows:  Date: 04/17/18, Time: 10:45  Location: [facility] Dr[facility]Fields  This Gastroenterology Pre-Precedure Review Form is being routed to the following provider(s): Walden Field NP

## 2018-03-09 MED ORDER — SODIUM CHLORIDE 0.9 % IV SOLN: 12.5000 mg | Freq: Once | INTRAVENOUS | Status: AC

## 2018-03-09 NOTE — Progress Notes (Signed)
Phenergan added to orders 

## 2018-03-09 NOTE — Addendum Note (Signed)
Addended by: Claudina Lick on: 03/09/2018 11:36 AM   Modules accepted: Orders

## 2018-04-17 ENCOUNTER — Encounter (HOSPITAL_COMMUNITY): Payer: Self-pay

## 2018-04-17 ENCOUNTER — Ambulatory Visit (HOSPITAL_COMMUNITY)
Admission: RE | Admit: 2018-04-17 | Discharge: 2018-04-17 | Disposition: A | Discharge status: Home / Self Care | Payer: Medicaid Other | Source: Ambulatory Visit | Attending: Gastroenterology | Admitting: Gastroenterology

## 2018-04-17 ENCOUNTER — Encounter (HOSPITAL_COMMUNITY)
Admission: RE | Disposition: A | Discharge status: Home / Self Care | Payer: Self-pay | Source: Ambulatory Visit | Attending: Gastroenterology

## 2018-04-17 ENCOUNTER — Other Ambulatory Visit: Payer: Self-pay

## 2018-04-17 DIAGNOSIS — Q438 Other specified congenital malformations of intestine: Secondary | ICD-10-CM | POA: Diagnosis not present

## 2018-04-17 DIAGNOSIS — F329 Major depressive disorder, single episode, unspecified: Secondary | ICD-10-CM | POA: Insufficient documentation

## 2018-04-17 DIAGNOSIS — Z800 Family history of malignant neoplasm of digestive organs: Secondary | ICD-10-CM | POA: Insufficient documentation

## 2018-04-17 DIAGNOSIS — Z1211 Encounter for screening for malignant neoplasm of colon: Secondary | ICD-10-CM | POA: Diagnosis not present

## 2018-04-17 DIAGNOSIS — K648 Other hemorrhoids: Secondary | ICD-10-CM | POA: Insufficient documentation

## 2018-04-17 HISTORY — PX: COLONOSCOPY: SHX5424

## 2018-04-17 SURGERY — COLONOSCOPY
Anesthesia: Moderate Sedation

## 2018-04-17 MED ORDER — MIDAZOLAM HCL 5 MG/5ML IJ SOLN
INTRAMUSCULAR | Status: AC
  Filled 2018-04-17: qty 10

## 2018-04-17 MED ORDER — MEPERIDINE HCL 100 MG/ML IJ SOLN
INTRAMUSCULAR | Status: DC | PRN
  Administered 2018-04-17 (×2): 25 mg

## 2018-04-17 MED ORDER — STERILE WATER FOR IRRIGATION IR SOLN
Status: DC | PRN
  Administered 2018-04-17: 1.5 mL

## 2018-04-17 MED ORDER — SODIUM CHLORIDE 0.9 % IV SOLN
INTRAVENOUS | Status: DC
  Administered 2018-04-17: 10:00:00 via INTRAVENOUS

## 2018-04-17 MED ORDER — MEPERIDINE HCL 100 MG/ML IJ SOLN
INTRAMUSCULAR | Status: AC
  Filled 2018-04-17: qty 2

## 2018-04-17 MED ORDER — SODIUM CHLORIDE 0.9% FLUSH
INTRAVENOUS | Status: AC
  Administered 2018-04-17: 10 mL
  Filled 2018-04-17: qty 10

## 2018-04-17 MED ORDER — MIDAZOLAM HCL 5 MG/5ML IJ SOLN
INTRAMUSCULAR | Status: DC | PRN
  Administered 2018-04-17 (×2): 2 mg via INTRAVENOUS

## 2018-04-17 MED ORDER — PROMETHAZINE HCL 25 MG/ML IJ SOLN
INTRAMUSCULAR | Status: AC
  Filled 2018-04-17: qty 1

## 2018-04-17 MED ORDER — PROMETHAZINE HCL 25 MG/ML IJ SOLN
12.5000 mg | Freq: Once | INTRAMUSCULAR | Status: AC
  Administered 2018-04-17: 12.5 mg via INTRAVENOUS

## 2018-04-17 NOTE — Discharge Instructions (Signed)
You have SMALL internal hemorrhoids. YOU DID NOT HAVE ANY POLYPS.   DRINK WATER TO KEEP YOUR URINE LIGHT YELLOW.  FOLLOW A HIGH FIBER DIET. AVOID ITEMS THAT CAUSE BLOATING. SEE INFO BELOW.  USE PREPARATION H FOUR TIMES  A DAY IF NEEDED TO RELIEVE RECTAL PAIN/PRESSURE/BLEEDING.  Next colonoscopy in 5 years.  Colonoscopy Care After Read the instructions outlined below and refer to this sheet in the next week. These discharge instructions provide you with general information on caring for yourself after you leave the hospital. While your treatment has been planned according to the most current medical practices available, unavoidable complications occasionally occur. If you have any problems or questions after discharge, call DR. FIELDS, [phone].  ACTIVITY  You may resume your regular activity, but move at a slower pace for the next 24 hours.   Take frequent rest periods for the next 24 hours.   Walking will help get rid of the air and reduce the bloated feeling in your belly (abdomen).   No driving for 24 hours (because of the medicine (anesthesia) used during the test).   You may shower.   Do not sign any important legal documents or operate any machinery for 24 hours (because of the anesthesia used during the test).    NUTRITION  Drink plenty of fluids.   You may resume your normal diet as instructed by your doctor.   Begin with a light meal and progress to your normal diet. Heavy or fried foods are harder to digest and may make you feel sick to your stomach (nauseated).   Avoid alcoholic beverages for 24 hours or as instructed.    MEDICATIONS  You may resume your normal medications.   WHAT YOU CAN EXPECT TODAY  Some feelings of bloating in the abdomen.   Passage of more gas than usual.   Spotting of blood in your stool or on the toilet paper  .  IF YOU HAD POLYPS REMOVED DURING THE COLONOSCOPY:  Eat a soft diet IF YOU HAVE NAUSEA, BLOATING, ABDOMINAL  PAIN, OR VOMITING.    FINDING OUT THE RESULTS OF YOUR TEST Not all test results are available during your visit. DR. Oneida Alar WILL CALL YOU WITHIN 14 DAYS OF YOUR PROCEDUE WITH YOUR RESULTS. Do not assume everything is normal if you have not heard from DR. FIELDS, CALL HER OFFICE AT [phone].  SEEK IMMEDIATE MEDICAL ATTENTION AND CALL THE OFFICE: [phone] IF:  You have more than a spotting of blood in your stool.   Your belly is swollen (abdominal distention).   You are nauseated or vomiting.   You have a temperature over 101F.   You have abdominal pain or discomfort that is severe or gets worse throughout the day.  High-Fiber Diet A high-fiber diet changes your normal diet to include more whole grains, legumes, fruits, and vegetables. Changes in the diet involve replacing refined carbohydrates with unrefined foods. The calorie level of the diet is essentially unchanged. The Dietary Reference Intake (recommended amount) for adult males is 38 grams per day. For adult females, it is 25 grams per day. Pregnant and lactating women should consume 28 grams of fiber per day. Fiber is the intact part of a plant that is not broken down during digestion. Functional fiber is fiber that has been isolated from the plant to provide a beneficial effect in the body. PURPOSE  Increase stool bulk.   Ease and regulate bowel movements.   Lower cholesterol.   REDUCE RISK OF COLON CANCER  INDICATIONS THAT YOU NEED MORE FIBER  Constipation and hemorrhoids.   Uncomplicated diverticulosis (intestine condition) and irritable bowel syndrome.   Weight management.   As a protective measure against hardening of the arteries (atherosclerosis), diabetes, and cancer.   GUIDELINES FOR INCREASING FIBER IN THE DIET  Start adding fiber to the diet slowly. A gradual increase of about 5 more grams (2 slices of whole-wheat bread, 2 servings of most fruits or vegetables, or 1 bowl of high-fiber cereal) per  day is best. Too rapid an increase in fiber may result in constipation, flatulence, and bloating.   Drink enough water and fluids to keep your urine clear or pale yellow. Water, juice, or caffeine-free drinks are recommended. Not drinking enough fluid may cause constipation.   Eat a variety of high-fiber foods rather than one type of fiber.   Try to increase your intake of fiber through using high-fiber foods rather than fiber pills or supplements that contain small amounts of fiber.   The goal is to change the types of food eaten. Do not supplement your present diet with high-fiber foods, but replace foods in your present diet.    INCLUDE A VARIETY OF FIBER SOURCES  Replace refined and processed grains with whole grains, canned fruits with fresh fruits, and incorporate other fiber sources. White rice, white breads, and most bakery goods contain little or no fiber.   Brown whole-grain rice, buckwheat oats, and many fruits and vegetables are all good sources of fiber. These include: broccoli, Brussels sprouts, cabbage, cauliflower, beets, sweet potatoes, white potatoes (skin on), carrots, tomatoes, eggplant, squash, berries, fresh fruits, and dried fruits.   Cereals appear to be the richest source of fiber. Cereal fiber is found in whole grains and bran. Bran is the fiber-rich outer coat of cereal grain, which is largely removed in refining. In whole-grain cereals, the bran remains. In breakfast cereals, the largest amount of fiber is found in those with "bran" in their names. The fiber content is sometimes indicated on the label.   You may need to include additional fruits and vegetables each day.   In baking, for 1 cup white flour, you may use the following substitutions:   1 cup whole-wheat flour minus 2 tablespoons.   1/2 cup white flour plus 1/2 cup whole-wheat flour.   Hemorrhoids Hemorrhoids are dilated (enlarged) veins around the rectum. Sometimes clots will form in the veins. This  makes them swollen and painful. These are called thrombosed hemorrhoids. Causes of hemorrhoids include:  Constipation.   Straining to have a bowel movement.   HEAVY LIFTING  HOME CARE INSTRUCTIONS  Eat a well balanced diet and drink 6 to 8 glasses of water every day to avoid constipation. You may also use a bulk laxative.   Avoid straining to have bowel movements.   Keep anal area dry and clean.   Do not use a donut shaped pillow or sit on the toilet for long periods. This increases blood pooling and pain.   Move your bowels when your body has the urge; this will require less straining and will decrease pain and pressure.
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
## 2018-04-17 NOTE — Op Note (Signed)
[facility] Patient Name: Holly Little Procedure Date: 04/17/2018 10:53 AM MRN: [id number] Date of Birth: Jun 04, 1965 Attending MD: Barney Drain MD, MD CSN: [id number] Age: 52 Admit Type: Outpatient Procedure:                Colonoscopy, SCREENING Indications:              Screening patient at increased risk: Family history                            of 1st-degree relative with colorectal cancer at                            age 44 years (or older) Providers:                Barney Drain MD, MD, Lurline Del, RN, Aram Candela Referring MD:             Rosemary Holms, MD Medicines:                Promethazine 12.5 mg IV, Meperidine 50 mg IV,                            Midazolam 4 mg IV Complications:            No immediate complications. Estimated Blood Loss:     Estimated blood loss: none. Procedure:                Pre-Anesthesia Assessment:                           - Prior to the procedure, a History and Physical                            was performed, and patient medications and                            allergies were reviewed. The patient's tolerance of                            previous anesthesia was also reviewed. The risks                            and benefits of the procedure and the sedation                            options and risks were discussed with the patient.                            All questions were answered, and informed consent                            was obtained. Prior Anticoagulants: The patient has                            taken no previous anticoagulant or antiplatelet  agents. ASA Grade Assessment: I - A normal, healthy                            patient. After reviewing the risks and benefits,                            the patient was deemed in satisfactory condition to                            undergo the procedure. After obtaining informed                            consent, the colonoscope was passed  under direct                            vision. Throughout the procedure, the patient's                            blood pressure, pulse, and oxygen saturations were                            monitored continuously. The CF-[id number] ([id number])                            scope was introduced through the anus and advanced                            to the the cecum, identified by appendiceal orifice                            and ileocecal valve. The ileocecal valve,                            appendiceal orifice, and rectum were photographed.                            The colonoscopy was somewhat difficult due to a                            tortuous colon. Successful completion of the                            procedure was aided by straightening and shortening                            the scope to obtain bowel loop reduction and                            COLOWRAP. The patient tolerated the procedure well.                            The quality of the bowel preparation was excellent. Scope In: 11:17:36 AM Scope Out: 11:34:16 AM Scope Withdrawal Time: 0 hours  13 minutes 4 seconds  Total Procedure Duration: 0 hours 16 minutes 40 seconds  Findings:      The recto-sigmoid colon, sigmoid colon and descending colon were       moderately tortuous.      Internal hemorrhoids were found. The hemorrhoids were small.      The exam was otherwise without abnormality. Impression:               - Tortuous colon.                           - Internal hemorrhoids.                           - The examination was otherwise normal. Moderate Sedation:      Moderate (conscious) sedation was administered by the endoscopy nurse       and supervised by the endoscopist. The following parameters were       monitored: oxygen saturation, heart rate, blood pressure, and response       to care. Total physician intraservice time was 29 minutes. Recommendation:           - Patient has a contact number available for                             emergencies. The signs and symptoms of potential                            delayed complications were discussed with the                            patient. Return to normal activities tomorrow.                            Written discharge instructions were provided to the                            patient.                           - High fiber diet.                           - Continue present medications.                           - Repeat colonoscopy in 5 years for surveillance. Procedure Code(s):        --- Professional ---                           [phone], Colonoscopy, flexible; diagnostic, including                            collection of specimen(s) by brushing or washing,                            when performed (separate procedure)  28786, Moderate sedation; each additional 15                            minutes intraservice time                           G0500, Moderate sedation services provided by the                            same physician or other qualified health care                            professional performing a gastrointestinal                            endoscopic service that sedation supports,                            requiring the presence of an independent trained                            observer to assist in the monitoring of the                            patient's level of consciousness and physiological                            status; initial 15 minutes of intra-service time;                            patient age 76 years or older (additional time may                            be reported with [phone], as appropriate) Diagnosis Code(s):        --- Professional ---                           Z80.0, Family history of malignant neoplasm of                            digestive organs                           K64.8, Other hemorrhoids                           Q43.8, Other specified congenital  malformations of                            intestine CPT copyright 2018 American Medical Association. All rights reserved. The codes documented in this report are preliminary and upon coder review may  be revised to meet current compliance requirements. Barney Drain, MD Barney Drain MD, MD 04/17/2018 11:49:33 AM This report has been signed electronically. Number of Addenda: 0

## 2018-04-20 ENCOUNTER — Encounter (HOSPITAL_COMMUNITY): Payer: Self-pay | Admitting: Gastroenterology

## 2018-11-07 ENCOUNTER — Encounter: Payer: Self-pay | Admitting: Family Medicine

## 2018-11-07 ENCOUNTER — Ambulatory Visit (INDEPENDENT_AMBULATORY_CARE_PROVIDER_SITE_OTHER): Payer: BC Managed Care – PPO | Admitting: Family Medicine

## 2018-11-07 ENCOUNTER — Other Ambulatory Visit: Payer: Self-pay

## 2018-11-07 DIAGNOSIS — M545 Low back pain: Secondary | ICD-10-CM

## 2018-11-07 DIAGNOSIS — M5430 Sciatica, unspecified side: Secondary | ICD-10-CM

## 2018-11-07 MED ORDER — HYDROCODONE-ACETAMINOPHEN 5-325 MG PO TABS: ORAL_TABLET | ORAL | 0 refills | Status: DC

## 2018-11-07 MED ORDER — PREDNISONE 20 MG PO TABS: ORAL_TABLET | ORAL | 0 refills | Status: DC

## 2018-11-07 NOTE — Progress Notes (Signed)
   Subjective:    Patient ID: Holly Little, female    DOB: Mar 12, 1966, 53 y.o.   MRN: [id number] aud plus vid Back Pain  This is a new problem. Episode onset: 2 weeks but really bad for one week. The pain is present in the lumbar spine. Radiates to: right leg. She has tried NSAIDs and ice for the symptoms. The treatment provided no relief.   Virtual Visit via Telephone Note  I connected with Holly Little on 11/07/18 at  4:00 PM EDT by telephone and verified that I am speaking with the correct person using two identifiers.  Location: Patient: home Provider: office   I discussed the limitations, risks, security and privacy concerns of performing an evaluation and management service by telephone and the availability of in person appointments. I also discussed with the patient that there may be a patient responsible charge related to this service. The patient expressed understanding and agreed to proceed.   History of Present Illness:    Observations/Objective:   Assessment and Plan:   Follow Up Instructions:    I discussed the assessment and treatment plan with the patient. The patient was provided an opportunity to ask questions and all were answered. The patient agreed with the plan and demonstrated an understanding of the instructions.   The patient was advised to call back or seek an in-person evaluation if the symptoms worsen or if the condition fails to improve as anticipated.  I provided 20 minutes of non-face-to-face time during this encounter.   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      Review of Systems  Musculoskeletal: Positive for back pain.       Objective:   Physical Exam  Virtual      Assessment & Plan:  Impression probable sciatica by history.  Nature of condition discussed.  Adult prednisone  taper.  Hydrocodone as needed for pain.  Use sparingly.  Hopefully this will calm down over the next week or 2

## 2019-02-27 ENCOUNTER — Ambulatory Visit (INDEPENDENT_AMBULATORY_CARE_PROVIDER_SITE_OTHER): Payer: Medicaid Other | Admitting: Family Medicine

## 2019-02-27 ENCOUNTER — Encounter: Payer: Self-pay | Admitting: Family Medicine

## 2019-02-27 DIAGNOSIS — M5430 Sciatica, unspecified side: Secondary | ICD-10-CM | POA: Diagnosis not present

## 2019-02-27 MED ORDER — HYDROCODONE-ACETAMINOPHEN 5-325 MG PO TABS: ORAL_TABLET | ORAL | 0 refills | Status: DC

## 2019-02-27 MED ORDER — PREDNISONE 20 MG PO TABS: ORAL_TABLET | ORAL | 0 refills | Status: DC

## 2019-02-27 NOTE — Progress Notes (Signed)
   Subjective:    Patient ID: Holly Little, female    DOB: [birth year], 53 y.o.   MRN: [id number]  HPI  Patient calls with flare of lower back pain for a week. Patient states she has had problems with it in the past.  Virtual Visit via Video Note  I connected with Holly Little on 02/27/19 at  9:30 AM EDT by a video enabled telemedicine application and verified that I am speaking with the correct person using two identifiers.  Location: Patient: home Provider: office    I discussed the limitations of evaluation and management by telemedicine and the availability of in person appointments. The patient expressed understanding and agreed to proceed.  History of Present Illness:    Observations/Objective:   Assessment and Plan:   Follow Up Instructions:    I discussed the assessment and treatment plan with the patient. The patient was provided an opportunity to ask questions and all were answered. The patient agreed with the plan and demonstrated an understanding of the instructions.   The patient was advised to call back or seek an in-person evaluation if the symptoms worsen or if the condition fails to improve as anticipated.  I provided 25 minutes of non-face-to-face time during this encounter.  Works in the health field  Took ibu   Pain in lower back, goes into the left side  At times the pain radiates all the way down the calf.  History of sciatica in the past.  Review of Systems No headache no chest pain no cough    Objective:   Physical Exam  Virtual      Assessment & Plan:  Impression probable sciatica.  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  Having repeated flares of low back pain and sciatica.  Patient had numerous questions about potentially achieving disability status.  Multiple questions were answered long discussion held regarding the bureaucratic and legal nature of this along with the  medical reality.  Greater than 50% of this 25 minute face to face visit was spent in counseling and discussion and coordination of care regarding the above diagnosis/diagnosies

## 2019-03-25 DIAGNOSIS — R52 Pain, unspecified: Secondary | ICD-10-CM | POA: Diagnosis not present

## 2019-03-25 DIAGNOSIS — I10 Essential (primary) hypertension: Secondary | ICD-10-CM | POA: Diagnosis not present

## 2019-03-27 ENCOUNTER — Ambulatory Visit (INDEPENDENT_AMBULATORY_CARE_PROVIDER_SITE_OTHER): Payer: Medicaid Other | Admitting: Family Medicine

## 2019-03-27 ENCOUNTER — Encounter: Payer: Self-pay | Admitting: Family Medicine

## 2019-03-27 ENCOUNTER — Other Ambulatory Visit: Payer: Self-pay

## 2019-03-27 VITALS — BP 130/76 | Temp 96.3°F | Wt 170.8 lb

## 2019-03-27 DIAGNOSIS — M79602 Pain in left arm: Secondary | ICD-10-CM | POA: Diagnosis not present

## 2019-03-27 DIAGNOSIS — S46912A Strain of unspecified muscle, fascia and tendon at shoulder and upper arm level, left arm, initial encounter: Secondary | ICD-10-CM | POA: Diagnosis not present

## 2019-03-27 DIAGNOSIS — S161XXA Strain of muscle, fascia and tendon at neck level, initial encounter: Secondary | ICD-10-CM | POA: Diagnosis not present

## 2019-03-27 MED ORDER — HYDROCODONE-ACETAMINOPHEN 5-325 MG PO TABS: ORAL_TABLET | ORAL | 0 refills | Status: DC
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
## 2019-05-04 ENCOUNTER — Encounter: Payer: Medicaid Other | Admitting: Nurse Practitioner

## 2019-07-04 ENCOUNTER — Encounter: Payer: Self-pay | Admitting: Family Medicine

## 2019-09-14 ENCOUNTER — Ambulatory Visit: Payer: Self-pay

## 2019-10-01 ENCOUNTER — Other Ambulatory Visit: Payer: Self-pay

## 2019-10-01 ENCOUNTER — Ambulatory Visit: Payer: Medicaid Other | Attending: Internal Medicine

## 2019-10-01 DIAGNOSIS — Z20822 Contact with and (suspected) exposure to covid-19: Secondary | ICD-10-CM

## 2019-10-02 LAB — NOVEL CORONAVIRUS, NAA: SARS-CoV-2, NAA: NOT DETECTED

## 2019-10-02 LAB — SARS-COV-2, NAA 2 DAY TAT

## 2020-01-08 ENCOUNTER — Ambulatory Visit
Admission: EM | Admit: 2020-01-08 | Discharge: 2020-01-08 | Disposition: A | Discharge status: Home / Self Care | Payer: Medicaid Other | Attending: Emergency Medicine | Admitting: Emergency Medicine

## 2020-01-08 ENCOUNTER — Other Ambulatory Visit: Payer: Self-pay

## 2020-01-08 DIAGNOSIS — Z20822 Contact with and (suspected) exposure to covid-19: Secondary | ICD-10-CM
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
## 2020-01-08 NOTE — Discharge Instructions (Signed)

## 2020-01-08 NOTE — ED Triage Notes (Signed)
Around someone that might have covid. Would like to be screened. Denies any s/s

## 2020-01-10 LAB — SARS-COV-2, NAA 2 DAY TAT

## 2020-01-10 LAB — NOVEL CORONAVIRUS, NAA: SARS-CoV-2, NAA: NOT DETECTED

## 2020-01-25 DIAGNOSIS — M545 Low back pain: Secondary | ICD-10-CM | POA: Diagnosis not present

## 2020-01-27 ENCOUNTER — Other Ambulatory Visit: Payer: Self-pay

## 2020-01-27 ENCOUNTER — Emergency Department (HOSPITAL_COMMUNITY): Payer: No Typology Code available for payment source

## 2020-01-27 ENCOUNTER — Emergency Department (HOSPITAL_COMMUNITY)
Admission: EM | Admit: 2020-01-27 | Discharge: 2020-01-27 | Disposition: A | Discharge status: Home / Self Care | Payer: No Typology Code available for payment source | Attending: Emergency Medicine | Admitting: Emergency Medicine

## 2020-01-27 ENCOUNTER — Encounter (HOSPITAL_COMMUNITY): Payer: Self-pay | Admitting: Emergency Medicine

## 2020-01-27 DIAGNOSIS — Y999 Unspecified external cause status: Secondary | ICD-10-CM | POA: Insufficient documentation

## 2020-01-27 DIAGNOSIS — S199XXA Unspecified injury of neck, initial encounter: Secondary | ICD-10-CM | POA: Diagnosis present

## 2020-01-27 DIAGNOSIS — S20212A Contusion of left front wall of thorax, initial encounter: Secondary | ICD-10-CM

## 2020-01-27 DIAGNOSIS — S39012A Strain of muscle, fascia and tendon of lower back, initial encounter: Secondary | ICD-10-CM | POA: Diagnosis not present

## 2020-01-27 DIAGNOSIS — Z79899 Other long term (current) drug therapy: Secondary | ICD-10-CM | POA: Insufficient documentation

## 2020-01-27 DIAGNOSIS — Z041 Encounter for examination and observation following transport accident: Secondary | ICD-10-CM | POA: Diagnosis not present

## 2020-01-27 DIAGNOSIS — S161XXA Strain of muscle, fascia and tendon at neck level, initial encounter: Secondary | ICD-10-CM

## 2020-01-27 DIAGNOSIS — Y92410 Unspecified street and highway as the place of occurrence of the external cause: Secondary | ICD-10-CM | POA: Diagnosis not present

## 2020-01-27 DIAGNOSIS — Y9389 Activity, other specified: Secondary | ICD-10-CM | POA: Diagnosis not present

## 2020-01-27 DIAGNOSIS — R222 Localized swelling, mass and lump, trunk: Secondary | ICD-10-CM | POA: Diagnosis not present

## 2020-01-27 DIAGNOSIS — R001 Bradycardia, unspecified: Secondary | ICD-10-CM | POA: Diagnosis not present

## 2020-01-27 DIAGNOSIS — M542 Cervicalgia: Secondary | ICD-10-CM | POA: Diagnosis not present

## 2020-01-27 MED ORDER — METHOCARBAMOL 500 MG PO TABS: 500.0000 mg | ORAL_TABLET | Freq: Three times a day (TID) | ORAL | 0 refills | Status: DC

## 2020-01-27 MED ORDER — LIDOCAINE 5 % EX PTCH: 1.0000 | MEDICATED_PATCH | CUTANEOUS | 0 refills | Status: DC

## 2020-01-27 MED ORDER — IBUPROFEN 800 MG PO TABS: 800.0000 mg | ORAL_TABLET | Freq: Three times a day (TID) | ORAL | 0 refills | Status: DC

## 2020-01-27 MED ORDER — IBUPROFEN 800 MG PO TABS
800.0000 mg | ORAL_TABLET | Freq: Once | ORAL | Status: AC
  Administered 2020-01-27: 800 mg via ORAL
  Filled 2020-01-27: qty 1
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
## 2020-01-27 NOTE — Discharge Instructions (Addendum)
Your x-rays and CT scans today are reassuring.  No evidence of acute bony injuries.  Your symptoms are likely from muscular strain.  I would recommend that you alternate off and on with ice.  I have written a prescription for lidocaine patches that you may apply to your lower neck and shoulders.  Follow-up with your primary doctor later this week for recheck.  Return to the emergency department for any worsening symptoms.

## 2020-01-28 ENCOUNTER — Telehealth: Payer: Self-pay | Admitting: *Deleted

## 2020-01-28 NOTE — Telephone Encounter (Signed)
Patient called back. Transition of care assessment completed:  Transition Care Management Follow-up Telephone Call  . Medicaid Managed Care Transition Call Status:MM [facility] Call Made  . Date of discharge and from where: [facility], 01/27/20  . How have you been since you were released from the hospital? "worse today than yesterday"  . Any questions or concerns? No  Items Reviewed: Marland Kitchen Did the pt receive and understand the discharge instructions provided? Yes  . Medications obtained and verified? No , Patient did not receive muscle relaxer when she picked up her medication on 01/27/20. Patient advised to contact pharmacy to check on status of medication. . Any new allergies since your discharge? No  . Dietary orders reviewed? No . Do you have support at home? Yes, family  Functional Questionnaire: (I = Independent and D = Dependent)  ADLs: Independent Bathing/Dressing:Independent Meal Prep: Independent Eating: Independent Maintaining continence: Independent Transferring/Ambulation: Independent Managing Meds: Independent   Follow up appointments reviewed:   PCP Hospital f/u appt confirmed? No , patient states she was told by her PCP's office to call back the morning of 8/31/2to schedule appt  Specialist Hospital f/u appt confirmed? N/A  Are transportation arrangements needed? No   If their condition worsens, is the pt aware to call PCP or go to the EmergencyDept.? Yes  Was the patient provided with contact information for the PCP's office or ED? Yes  Was to pt encouraged to call back with questions or concerns?  Yes  Lenor Coffin, RN, BSN, [place] [place] [phone]

## 2020-01-28 NOTE — Telephone Encounter (Signed)
Transition Care Management Unsuccessful Follow-up Telephone Call  Date of discharge and from where:  [facility], 01/27/20  Attempts:  1st Attempt  Reason for unsuccessful TCM follow-up call:  Left voice message.  Lenor Coffin, RN, BSN, [place] [facility] [place] [phone]

## 2020-01-29 ENCOUNTER — Ambulatory Visit (INDEPENDENT_AMBULATORY_CARE_PROVIDER_SITE_OTHER): Payer: Medicaid Other | Admitting: Nurse Practitioner

## 2020-01-29 ENCOUNTER — Encounter: Payer: Self-pay | Admitting: Nurse Practitioner

## 2020-01-29 ENCOUNTER — Other Ambulatory Visit: Payer: Self-pay

## 2020-01-29 DIAGNOSIS — M542 Cervicalgia: Secondary | ICD-10-CM | POA: Diagnosis not present

## 2020-01-29 DIAGNOSIS — M25512 Pain in left shoulder: Secondary | ICD-10-CM | POA: Diagnosis not present

## 2020-01-29 DIAGNOSIS — S46912D Strain of unspecified muscle, fascia and tendon at shoulder and upper arm level, left arm, subsequent encounter: Secondary | ICD-10-CM | POA: Diagnosis not present

## 2020-01-29 DIAGNOSIS — M545 Low back pain: Secondary | ICD-10-CM

## 2020-01-29 DIAGNOSIS — S39012D Strain of muscle, fascia and tendon of lower back, subsequent encounter: Secondary | ICD-10-CM

## 2020-01-29 DIAGNOSIS — S161XXD Strain of muscle, fascia and tendon at neck level, subsequent encounter: Secondary | ICD-10-CM | POA: Diagnosis not present

## 2020-01-29 MED ORDER — CYCLOBENZAPRINE HCL 10 MG PO TABS: ORAL_TABLET | ORAL | 0 refills | Status: DC

## 2020-01-29 MED ORDER — MELOXICAM 15 MG PO TABS: 15.0000 mg | ORAL_TABLET | Freq: Every day | ORAL | 0 refills | Status: AC

## 2020-01-29 NOTE — Patient Instructions (Signed)
Lidocaine patches (Salonpas)
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
## 2020-01-30 ENCOUNTER — Encounter: Payer: Self-pay | Admitting: Nurse Practitioner
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
## 2020-02-07 ENCOUNTER — Other Ambulatory Visit: Payer: Self-pay

## 2020-02-07 ENCOUNTER — Encounter: Payer: Self-pay | Admitting: Family Medicine

## 2020-02-07 ENCOUNTER — Ambulatory Visit: Payer: Medicaid Other | Admitting: Family Medicine

## 2020-02-07 VITALS — BP 118/86 | HR 72 | Temp 97.5°F | Ht 67.0 in | Wt 165.0 lb

## 2020-02-07 DIAGNOSIS — M79602 Pain in left arm: Secondary | ICD-10-CM

## 2020-02-07 DIAGNOSIS — S46812D Strain of other muscles, fascia and tendons at shoulder and upper arm level, left arm, subsequent encounter: Secondary | ICD-10-CM
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
## 2020-02-07 NOTE — Patient Instructions (Signed)
Try to take the half of Flexeril today by 4:00 and use other strategies to relieve the muscle spasms you are feeling in your left shoulder area.  If this helps, we know this is a spasm and it will get better. If this does not help, then we know to go to the next step.  Use I[place] Hot and massage as you can. Salonpas patches or similar product.  Cervical Strain and Sprain Rehab Ask your health care provider which exercises are safe for you. Do exercises exactly as told by your health care provider and adjust them as directed. It is normal to feel mild stretching, pulling, tightness, or discomfort as you do these exercises. Stop right away if you feel sudden pain or your pain gets worse. Do not begin these exercises until told by your health care provider. Stretching and range-of-motion exercises Cervical side bending  1. Using good posture, sit on a stable chair or stand up. 2. Without moving your shoulders, slowly tilt your left / right ear to your shoulder until you feel a stretch in the opposite side neck muscles. You should be looking straight ahead. 3. Hold for __________ seconds. 4. Repeat with the other side of your neck. Repeat __________ times. Complete this exercise __________ times a day. Cervical rotation  1. Using good posture, sit on a stable chair or stand up. 2. Slowly turn your head to the side as if you are looking over your left / right shoulder. ? Keep your eyes level with the ground. ? Stop when you feel a stretch along the side and the back of your neck. 3. Hold for __________ seconds. 4. Repeat this by turning to your other side. Repeat __________ times. Complete this exercise __________ times a day. Thoracic extension and pectoral stretch 1. Roll a towel or a small blanket so it is about 4 inches (10 cm) in diameter. 2. Lie down on your back on a firm surface. 3. Put the towel lengthwise, under your spine in the middle of your back. It should not be under your shoulder  blades. The towel should line up with your spine from your middle back to your lower back. 4. Put your hands behind your head and let your elbows fall out to your sides. 5. Hold for __________ seconds. Repeat __________ times. Complete this exercise __________ times a day. Strengthening exercises Isometric upper cervical flexion 1. Lie on your back with a thin pillow behind your head and a small rolled-up towel under your neck. 2. Gently tuck your chin toward your chest and nod your head down to look toward your feet. Do not lift your head off the pillow. 3. Hold for __________ seconds. 4. Release the tension slowly. Relax your neck muscles completely before you repeat this exercise. Repeat __________ times. Complete this exercise __________ times a day. Isometric cervical extension  1. Stand about 6 inches (15 cm) away from a wall, with your back facing the wall. 2. Place a soft object, about 6-8 inches (15-20 cm) in diameter, between the back of your head and the wall. A soft object could be a small pillow, a ball, or a folded towel. 3. Gently tilt your head back and press into the soft object. Keep your jaw and forehead relaxed. 4. Hold for __________ seconds. 5. Release the tension slowly. Relax your neck muscles completely before you repeat this exercise. Repeat __________ times. Complete this exercise __________ times a day. Posture and body mechanics Body mechanics refers to the movements  and positions of your body while you do your daily activities. Posture is part of body mechanics. Good posture and healthy body mechanics can help to relieve stress in your body's tissues and joints. Good posture means that your spine is in its natural S-curve position (your spine is neutral), your shoulders are pulled back slightly, and your head is not tipped forward. The following are general guidelines for applying improved posture and body mechanics to your everyday activities. Sitting  1. When  sitting, keep your spine neutral and keep your feet flat on the floor. Use a footrest, if necessary, and keep your thighs parallel to the floor. Avoid rounding your shoulders, and avoid tilting your head forward. 2. When working at a desk or a computer, keep your desk at a height where your hands are slightly lower than your elbows. Slide your chair under your desk so you are close enough to maintain good posture. 3. When working at a computer, place your monitor at a height where you are looking straight ahead and you do not have to tilt your head forward or downward to look at the screen. Standing   When standing, keep your spine neutral and keep your feet about hip-width apart. Keep a slight bend in your knees. Your ears, shoulders, and hips should line up.  When you do a task in which you stand in one place for a long time, place one foot up on a stable object that is 2-4 inches (5-10 cm) high, such as a footstool. This helps keep your spine neutral. Resting When lying down and resting, avoid positions that are most painful for you. Try to support your neck in a neutral position. You can use a contour pillow or a small rolled-up towel. Your pillow should support your neck but not push on it. This information is not intended to replace advice given to you by your health care provider. Make sure you discuss any questions you have with your health care provider. Document Revised: 09/06/2018 Document Reviewed: 02/15/2018 Elsevier Patient Education  [place].  Muscle Cramps and Spasms Muscle cramps and spasms are when muscles tighten by themselves. They usually get better within minutes. Muscle cramps are painful. They are usually stronger and last longer than muscle spasms. Muscle spasms may or may not be painful. They can last a few seconds or much longer. Cramps and spasms can affect any muscle, but they occur most often in the calf muscles of the leg. They are usually not caused by a  serious problem. In many cases, the cause is not known. Some common causes include:  Doing more physical work or exercise than your body is ready for.  Using the muscles too much (overuse) by repeating certain movements too many times.  Staying in a certain position for a long time.  Playing a sport or doing an activity without preparing properly.  Using bad form or technique while playing a sport or doing an activity.  Not having enough water in your body (dehydration).  Injury.  Side effects of some medicines.  Low levels of the salts and minerals in your blood (electrolytes), such as low potassium or calcium. Follow these instructions at home: Managing pain and stiffness      Massage, stretch, and relax the muscle. Do this for many minutes at a time.  If told, put heat on tight or tense muscles as often as told by your doctor. Use the heat source that your doctor recommends, such as a  moist heat pack or a heating pad. ? Place a towel between your skin and the heat source. ? Leave the heat on for 20-30 minutes. ? Remove the heat if your skin turns bright red. This is very important if you are not able to feel pain, heat, or cold. You may have a greater risk of getting burned.  If told, put ice on the affected area. This may help if you are sore or have pain after a cramp or spasm. ? Put ice in a plastic bag. ? Place a towel between your skin and the bag. ? Leave the ice on for 20 minutes, 2-3 times a day.  Try taking hot showers or baths to help relax tight muscles. Eating and drinking  Drink enough fluid to keep your pee (urine) pale yellow.  Eat a healthy diet to help ensure that your muscles work well. This should include: ? Fruits and vegetables. ? Lean protein. ? Whole grains. ? Low-fat or nonfat dairy products. General instructions  If you are having cramps often, avoid intense exercise for several days.  Take over-the-counter and prescription medicines only  as told by your doctor.  Watch for any changes in your symptoms.  Keep all follow-up visits as told by your doctor. This is important. Contact a doctor if:  Your cramps or spasms get worse or happen more often.  Your cramps or spasms do not get better with time. Summary  Muscle cramps and spasms are when muscles tighten by themselves. They usually get better within minutes.  Cramps and spasms occur most often in the calf muscles of the leg.  Massage, stretch, and relax the muscle. This may help the cramp or spasm go away.  Drink enough fluid to keep your pee (urine) pale yellow. This information is not intended to replace advice given to you by your health care provider. Make sure you discuss any questions you have with your health care provider. Document Revised: 10/10/2017 Document Reviewed: 10/10/2017 Elsevier Patient Education  [place].

## 2020-04-09 ENCOUNTER — Ambulatory Visit (INDEPENDENT_AMBULATORY_CARE_PROVIDER_SITE_OTHER): Payer: Medicaid Other | Admitting: Family Medicine

## 2020-04-09 ENCOUNTER — Other Ambulatory Visit: Payer: Self-pay

## 2020-04-09 DIAGNOSIS — R059 Cough, unspecified: Secondary | ICD-10-CM

## 2020-04-09 DIAGNOSIS — J0110 Acute frontal sinusitis, unspecified: Secondary | ICD-10-CM | POA: Diagnosis not present

## 2020-04-09 MED ORDER — AMOXICILLIN 500 MG PO CAPS: 500.0000 mg | ORAL_CAPSULE | Freq: Three times a day (TID) | ORAL | 0 refills | Status: DC

## 2020-04-09 NOTE — Progress Notes (Signed)
Patient ID: Holly Little, female    DOB: [birth year], 54 y.o.   MRN: [id number]   Chief Complaint  Patient presents with  . Cough    congestion and headache for 3 days   Subjective:    HPI Having coughing and congestion for past 3 days.  Having headache in frontal and temporal area.  Taking mucinex and tylenol prn. Has been around brother that is sick this week with similar symptoms and took him to his doctor 2 days ago and pending covid testing still.   Pt denies fever, ear pain or sore throat. Pt doesn't think been around anyone with covid.  Has not had covid vaccines.  No sob or loss taste/smell. No h/o asthma or copd.   Medical History Holly Little has a past medical history of Anemia, Depression, Family history of adverse reaction to anesthesia, Headache(784.0), Left shoulder pain, and Neuromuscular disorder ([place]).   Outpatient Encounter Medications as of 04/09/2020  Medication Sig  . amoxicillin (AMOXIL) 500 MG capsule Take 1 capsule (500 mg total) by mouth 3 (three) times daily.  . APPLE CIDER VINEGAR PO Take 450 mg by mouth daily after lunch.  . Biotin 10000 MCG TABS Take 10,000 mcg by mouth daily after lunch.  . cholecalciferol (VITAMIN D) 25 MCG (1000 UT) tablet Take 1,000 Units by mouth daily after lunch.  . cyclobenzaprine (FLEXERIL) 10 MG tablet Take 1/2-1 tab po TID prn muscle spasms. Caution drowsiness.  . meloxicam (MOBIC) 15 MG tablet Take 1 tablet (15 mg total) by mouth daily. Prn pain  . Multiple Vitamins-Minerals (MULTIVITAMIN WITH MINERALS) tablet Take 1 tablet by mouth daily after lunch.   . SUPER B COMPLEX/C PO Take 1 tablet by mouth daily after lunch.  . vitamin B-12 (CYANOCOBALAMIN) 1000 MCG tablet Take 1,000 mcg by mouth daily after lunch.  . vitamin E (VITAMIN E) 200 UNIT capsule Take 200 Units by mouth daily after lunch.   Facility-Administered Encounter Medications as of 04/09/2020  Medication  . promethazine (PHENERGAN) 12.5 mg in sodium  chloride 0.9 % 1,000 mL infusion     Review of Systems  Constitutional: Negative for chills and fever.  HENT: Positive for congestion. Negative for ear pain, rhinorrhea, sinus pressure, sinus pain and sore throat.   Eyes: Negative for pain, discharge and itching.  Respiratory: Positive for cough.   Gastrointestinal: Negative for constipation, diarrhea, nausea and vomiting.  Neurological: Positive for headaches.     Vitals 98.1F, 98% ox sat, HR 76, rr 16.  Objective:   Physical Exam Vitals and nursing note reviewed.  Constitutional:      General: She is not in acute distress.    Appearance: Normal appearance. She is not toxic-appearing.  HENT:     Head: Normocephalic and atraumatic.     Right Ear: Tympanic membrane, ear canal and external ear normal.     Left Ear: Tympanic membrane, ear canal and external ear normal.     Nose: Rhinorrhea present. No congestion.     Mouth/Throat:     Mouth: Mucous membranes are moist.     Pharynx: Oropharynx is clear. No oropharyngeal exudate or posterior oropharyngeal erythema.  Eyes:     Extraocular Movements: Extraocular movements intact.     Conjunctiva/sclera: Conjunctivae normal.     Pupils: Pupils are equal, round, and reactive to light.  Pulmonary:     Effort: Pulmonary effort is normal. No respiratory distress.  Musculoskeletal:     Cervical back: Normal range of motion.  Lymphadenopathy:  Cervical: No cervical adenopathy.  Skin:    General: Skin is warm and dry.     Findings: No rash.  Neurological:     Mental Status: She is alert and oriented to person, place, and time.  Psychiatric:        Mood and Affect: Mood normal.        Behavior: Behavior normal.      Assessment and Plan   1. Acute non-recurrent frontal sinusitis - amoxicillin (AMOXIL) 500 MG capsule; Take 1 capsule (500 mg total) by mouth 3 (three) times daily.  Dispense: 30 capsule; Refill: 0  2. Cough - Novel Coronavirus, NAA ([facility])   Advising  symptomatic tx for viral illness. Reviewed usual course of viral illness vs. Sinusitis and antibiotic use.  Gave script for antibiotics to take, but reviewed that if it's viral illness it might not get better with abx and may still run course of 7-10 days.  increase fluids and call if not improving in next 2-3 days.   Pending covid testing. Will call pt with results.   If positive pt needing to quarantine for 10 days of start of symptoms. mucinex -bid with increase in fluids.  Flonase- for congestion. Tylenol/ibuprofen prn for bodyaches/fever.  Pt in agreement with plan. F/u prn.

## 2020-04-10 LAB — SARS-COV-2, NAA 2 DAY TAT

## 2020-04-10 LAB — NOVEL CORONAVIRUS, NAA: SARS-CoV-2, NAA: DETECTED — AB

## 2020-04-11 ENCOUNTER — Telehealth: Payer: Self-pay | Admitting: Nurse Practitioner

## 2020-04-11 NOTE — Telephone Encounter (Signed)
Called to Discuss with patient about Covid symptoms and the use of the monoclonal antibody infusion for those with mild to moderate Covid symptoms and at a high risk of hospitalization.     Pt appears to qualify for this infusion due to co-morbid conditions and/or a member of an at-risk group in accordance with the FDA Emergency Use Authorization.    Unable to reach pt. Voicemail left and My Chart message sent.   Nikki Pickenpack-Cousar, NP [facility]  [phone]    

## 2020-04-12 ENCOUNTER — Telehealth: Payer: Self-pay | Admitting: Nurse Practitioner

## 2020-04-12 NOTE — Telephone Encounter (Signed)
Called to Discuss with patient about Covid symptoms and the use of the monoclonal antibody infusion for those with mild to moderate Covid symptoms and at a high risk of hospitalization.     Pt appears to qualify for this infusion due to co-morbid conditions and/or a member of an at-risk group in accordance with the FDA Emergency Use Authorization. Patient wishes to see how she feels and will contact infusion center if she decides to proceed with infusion.   Alda Lea, NP [facility]  [phone]
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
## 2021-08-21 ENCOUNTER — Ambulatory Visit (INDEPENDENT_AMBULATORY_CARE_PROVIDER_SITE_OTHER): Payer: Medicaid Other | Admitting: Nurse Practitioner

## 2021-08-21 ENCOUNTER — Other Ambulatory Visit: Payer: Self-pay

## 2021-08-21 VITALS — BP 152/82 | HR 57 | Temp 98.2°F | Ht 67.0 in | Wt 164.0 lb

## 2021-08-21 DIAGNOSIS — Z1151 Encounter for screening for human papillomavirus (HPV): Secondary | ICD-10-CM | POA: Diagnosis not present

## 2021-08-21 DIAGNOSIS — Z01419 Encounter for gynecological examination (general) (routine) without abnormal findings: Secondary | ICD-10-CM | POA: Diagnosis not present

## 2021-08-21 DIAGNOSIS — R030 Elevated blood-pressure reading, without diagnosis of hypertension: Secondary | ICD-10-CM

## 2021-08-21 DIAGNOSIS — Z124 Encounter for screening for malignant neoplasm of cervix: Secondary | ICD-10-CM | POA: Diagnosis not present

## 2021-08-21 DIAGNOSIS — N3941 Urge incontinence: Secondary | ICD-10-CM

## 2021-08-21 DIAGNOSIS — G4440 Drug-induced headache, not elsewhere classified, not intractable: Secondary | ICD-10-CM

## 2021-08-21 DIAGNOSIS — T3995XA Adverse effect of unspecified nonopioid analgesic, antipyretic and antirheumatic, initial encounter: Secondary | ICD-10-CM | POA: Diagnosis not present

## 2021-08-21 DIAGNOSIS — R3915 Urgency of urination: Secondary | ICD-10-CM

## 2021-08-21 DIAGNOSIS — N950 Postmenopausal bleeding: Secondary | ICD-10-CM

## 2021-08-21 DIAGNOSIS — Z1231 Encounter for screening mammogram for malignant neoplasm of breast: Secondary | ICD-10-CM | POA: Diagnosis not present

## 2021-08-21 DIAGNOSIS — Z1159 Encounter for screening for other viral diseases: Secondary | ICD-10-CM

## 2021-08-21 LAB — POCT URINALYSIS DIPSTICK
Bilirubin, UA: NEGATIVE
Glucose, UA: NEGATIVE
Ketones, UA: NEGATIVE
Leukocytes, UA: NEGATIVE
Nitrite, UA: NEGATIVE
Odor: NORMAL
Protein, UA: NEGATIVE
Spec Grav, UA: 1.01 (ref 1.010–1.025)
Urobilinogen, UA: 0.2 E.U./dL
pH, UA: 6.5 (ref 5.0–8.0)

## 2021-08-21 MED ORDER — MIRABEGRON ER 25 MG PO TB24: 25.0000 mg | ORAL_TABLET | Freq: Every day | ORAL | 0 refills | Status: DC

## 2021-08-21 NOTE — Progress Notes (Signed)
? ?Subjective:  ? ? Patient ID: Holly Little, female    DOB: 11-10-1965, 56 y.o.   MRN: [id number] ? ?HPI ?Presents today for her annual physical. Daily walking for exercise and eats a well balanced meal once a day. Non smoker and no alcohol use. Not currently sexually active and no new partners since last physical.  Menopausal, reports one day of vaginal bleeding once last year after several years of no menses. Reports frequency and urgency, no dysuria, or hematuria. Difficulty controlling bladder due to urgency. Having to wear pads as a precaution. Urine linkage does not occur with coughing or sneezing.   ?Patient plans on making appointment this year for visual and dental exams.  ?Reports daily headache. Describes as a throbbing pain or a pulsing sensation at the occipital and right and left temporal regions of the head. Often accompanied by extreme sensitivity to light and sound. Headaches can last for 5-30 mins, and the pain can be so severe that it interferes with daily activity. Takes frequent Ibuprofen for the pain.  ?  ? ?Review of Systems  ?Constitutional:  Negative for activity change, appetite change and fatigue.  ?HENT:  Negative for sore throat and trouble swallowing.   ?Eyes:  Negative for visual disturbance.  ?Respiratory:  Negative for cough, chest tightness, shortness of breath and wheezing.   ?Cardiovascular:  Negative for chest pain.  ?Gastrointestinal:  Negative for abdominal distention, abdominal pain, constipation, diarrhea, nausea and vomiting.  ?Genitourinary:  Positive for enuresis, frequency, urgency and vaginal bleeding. Negative for difficulty urinating, dysuria, genital sores, menstrual problem, pelvic pain and vaginal discharge.  ?     One episode of vaginal bleeding months ago.   ?Neurological:  Positive for headaches. Negative for dizziness, tremors, weakness and numbness.  ? ?   ?Objective:  ? Physical Exam ?Vitals and nursing note reviewed. Exam conducted with a chaperone  present.  ?Constitutional:   ?   General: She is not in acute distress. ?   Appearance: She is well-developed.  ?Neck:  ?   Thyroid: No thyromegaly.  ?   Trachea: No tracheal deviation.  ?   Comments: Thyroid non tender to palpation. No mass or goiter noted.  ?Cardiovascular:  ?   Rate and Rhythm: Normal rate and regular rhythm.  ?   Heart sounds: Normal heart sounds.  ?Pulmonary:  ?   Effort: Pulmonary effort is normal.  ?   Breath sounds: Normal breath sounds.  ?Chest:  ?Breasts: ?   Right: No swelling, inverted nipple, mass, skin change or tenderness.  ?   Left: No swelling, inverted nipple, mass, skin change or tenderness.  ?Abdominal:  ?   General: There is no distension.  ?   Palpations: Abdomen is soft.  ?   Tenderness: There is no abdominal tenderness.  ?Genitourinary: ?   General: Normal vulva.  ?   Vagina: No vaginal discharge.  ?   Comments: External GU: no rashes, lesions or discharge. No CMT. Bimanual exam: no tenderness noted. Moderate bulging along the anterior vaginal wall slightly worse with valsalva.  ? ? ?Musculoskeletal:     ?   General: No swelling or tenderness.  ?   Cervical back: Normal range of motion and neck supple.  ?Lymphadenopathy:  ?   Cervical: No cervical adenopathy.  ?   Upper Body:  ?   Right upper body: No supraclavicular, axillary or pectoral adenopathy.  ?   Left upper body: No supraclavicular, axillary or pectoral adenopathy.  ?Skin: ?  General: Skin is warm and dry.  ?Neurological:  ?   Mental Status: She is alert and oriented to person, place, and time.  ?Psychiatric:     ?   Mood and Affect: Mood normal.     ?   Behavior: Behavior normal.     ?   Thought Content: Thought content normal.     ?   Judgment: Judgment normal.  ? ?Today's Vitals  ? 08/21/21 1309  ?BP: (!) 152/82  ?Pulse: (!) 57  ?Temp: 98.2 ?F (36.8 ?C)  ?SpO2: 100%  ?Weight: 164 lb (74.4 kg)  ?Height: '5\' 7"'$  (1.702 m)  ? ?Body mass index is 25.69 kg/m?.  ? ?  08/21/2021  ?  4:42 PM  ?Depression screen PHQ 2/9   ?Decreased Interest 1  ?Down, Depressed, Hopeless 0  ?PHQ - 2 Score 1  ?Altered sleeping 1  ?Tired, decreased energy 1  ?Change in appetite 0  ?Feeling bad or failure about yourself  1  ?Trouble concentrating 0  ?Moving slowly or fidgety/restless 0  ?Suicidal thoughts 0  ?PHQ-9 Score 4  ?Difficult doing work/chores Not difficult at all  ? ? ?  08/21/2021  ?  4:43 PM  ?GAD 7 : Generalized Anxiety Score  ?Nervous, Anxious, on Edge 1  ?Control/stop worrying 1  ?Worry too much - different things 0  ?Trouble relaxing 0  ?Restless 0  ?Easily annoyed or irritable 0  ?Afraid - awful might happen 0  ?Total GAD 7 Score 2  ?Anxiety Difficulty Not difficult at all  ? ?Recent Results (from the past 2160 hour(s))  ?POCT Urinalysis Dipstick     Status: None  ? Collection Time: 08/21/21  2:17 PM  ?Result Value Ref Range  ? Color, UA yellow   ? Clarity, UA clear   ? Glucose, UA Negative Negative  ? Bilirubin, UA negative   ? Ketones, UA negative   ? Spec Grav, UA 1.010 1.010 - 1.025  ? Blood, UA small   ? pH, UA 6.5 5.0 - 8.0  ? Protein, UA Negative Negative  ? Urobilinogen, UA 0.2 0.2 or 1.0 E.U./dL  ? Nitrite, UA negative   ? Leukocytes, UA Negative Negative  ? Appearance clear   ? Odor normal   ?The 10-year ASCVD risk score (Arnett DK, et al., 2019) is: 6.9% ?  Values used to calculate the score: ?    Age: 72 years ?    Sex: Female ?    Is Non-Hispanic African American: Yes ?    Diabetic: No ?    Tobacco smoker: No ?    Systolic Blood Pressure: 017 mmHg ?    Is BP treated: No ?    HDL Cholesterol: 57 mg/dL ?    Total Cholesterol: 213 mg/dL ? ? ?  ? ?Assessment & Plan  ? ?Problem List Items Addressed This Visit   ? ?  ? Other  ? Analgesic rebound headache  ? Elevated BP without diagnosis of hypertension  ? Post-menopausal bleeding  ? Relevant Orders  ? Comprehensive metabolic panel (Completed)  ? CBC with Differential/Platelet (Completed)  ? Lipid panel (Completed)  ? TSH (Completed)  ? [place] (Completed)  ? Hepatitis C antibody  (Completed)  ? US Pelvic Complete With Transvaginal  ? Urge urinary incontinence  ? Relevant Medications  ? mirabegron ER (MYRBETRIQ) 25 MG TB24 tablet  ? Other Relevant Orders  ? POCT Urinalysis Dipstick (Completed)  ? ?Other Visit Diagnoses   ? ? Well woman exam    -  Primary  ? Relevant Orders  ? IGP, Aptima HPV  ? Comprehensive metabolic panel (Completed)  ? CBC with Differential/Platelet (Completed)  ? Lipid panel (Completed)  ? TSH (Completed)  ? [place] (Completed)  ? Hepatitis C antibody (Completed)  ? MM DIGITAL SCREENING BILATERAL  ? US Pelvic Complete With Transvaginal  ? Screening for cervical cancer      ? Relevant Orders  ? IGP, Aptima HPV  ? Need for hepatitis C screening test      ? Relevant Orders  ? Hepatitis C antibody (Completed)  ? Encounter for screening mammogram for malignant neoplasm of breast      ? Relevant Orders  ? MM DIGITAL SCREENING BILATERAL  ? Screening for HPV (human papillomavirus)      ? Relevant Orders  ? IGP, Aptima HPV  ? ?  ? ? ? ? ?Meds ordered this encounter  ?Medications  ? mirabegron ER (MYRBETRIQ) 25 MG TB24 tablet  ?  Sig: Take 1 tablet (25 mg total) by mouth daily. For bladder symptoms  ?  Dispense:  30 tablet  ?  Refill:  0  ?  Order Specific Question:   Supervising Provider  ?  Answer:   Sallee Lange A [[id number]]  ? ?Start Myrbetriq for urinary urge incontinence.  ?Patient will be sent a my chart message to keep a headache diary and bring to visit in one month. Seek help earlier if new or worsening symptoms.  ?Encouraged  patient to continue healthy diet and exercise routine.    ?Home BP check encouraged and results sent in a week for  follow up.  ?Recommend eye and dental exams this year.  ?Return in about 1 month (around 09/21/2021). ?  ? ? ? ?

## 2021-08-21 NOTE — Progress Notes (Signed)
? ?  Subjective:  ? ? Patient ID: Holly Little, female    DOB: 1965-08-03, 56 y.o.   MRN: [id number] ? ?HPI ?The patient comes in today for a wellness visit. ? ? ? ?A review of their health history was completed. ? A review of medications was also completed. ? ?Any needed refills; flexeril for back pain  ? ?Eating habits: good ? ?Falls/  MVA accidents in past few months: no ? ?Regular exercise: walking , and other exercise ? ?Specialist pt sees on regular basis: none ? ?Preventative health issues were discussed.  ? ?Review of Systems ? ?   ?Objective:  ? Physical Exam ? ? ? ? ?   ?Assessment & Plan:  ? ? ?

## 2021-08-22 LAB — CBC WITH DIFFERENTIAL/PLATELET
Basophils Absolute: 0 10*3/uL (ref 0.0–0.2)
Basos: 0 %
EOS (ABSOLUTE): 0.1 10*3/uL (ref 0.0–0.4)
Eos: 2 %
Hematocrit: 35.9 % (ref 34.0–46.6)
Hemoglobin: 12.1 g/dL (ref 11.1–15.9)
Immature Grans (Abs): 0 10*3/uL (ref 0.0–0.1)
Immature Granulocytes: 0 %
Lymphocytes Absolute: 2.1 10*3/uL (ref 0.7–3.1)
Lymphs: 37 %
MCH: 29.5 pg (ref 26.6–33.0)
MCHC: 33.7 g/dL (ref 31.5–35.7)
MCV: 88 fL (ref 79–97)
Monocytes Absolute: 0.4 10*3/uL (ref 0.1–0.9)
Monocytes: 7 %
Neutrophils Absolute: 3.1 10*3/uL (ref 1.4–7.0)
Neutrophils: 54 %
Platelets: 184 10*3/uL (ref 150–450)
RBC: 4.1 x10E6/uL (ref 3.77–5.28)
RDW: 13.1 % (ref 11.7–15.4)
WBC: 5.7 10*3/uL (ref 3.4–10.8)

## 2021-08-22 LAB — COMPREHENSIVE METABOLIC PANEL
ALT: 23 IU/L (ref 0–32)
AST: 26 IU/L (ref 0–40)
Albumin/Globulin Ratio: 1.6 (ref 1.2–2.2)
Albumin: 4.7 g/dL (ref 3.8–4.9)
Alkaline Phosphatase: 62 IU/L (ref 44–121)
BUN/Creatinine Ratio: 15 (ref 9–23)
BUN: 10 mg/dL (ref 6–24)
Bilirubin Total: 0.4 mg/dL (ref 0.0–1.2)
CO2: 23 mmol/L (ref 20–29)
Calcium: 9.6 mg/dL (ref 8.7–10.2)
Chloride: 105 mmol/L (ref 96–106)
Creatinine, Ser: 0.68 mg/dL (ref 0.57–1.00)
Globulin, Total: 2.9 g/dL (ref 1.5–4.5)
Glucose: 69 mg/dL — ABNORMAL LOW (ref 70–99)
Potassium: 4 mmol/L (ref 3.5–5.2)
Sodium: 146 mmol/L — ABNORMAL HIGH (ref 134–144)
Total Protein: 7.6 g/dL (ref 6.0–8.5)
eGFR: 102 mL/min/{1.73_m2} (ref >59)

## 2021-08-22 LAB — LIPID PANEL
Chol/HDL Ratio: 3.7 ratio (ref 0.0–4.4)
Cholesterol, Total: 213 mg/dL — ABNORMAL HIGH (ref 100–199)
HDL: 57 mg/dL (ref >39)
LDL Chol Calc (NIH): 144 mg/dL — ABNORMAL HIGH (ref 0–99)
Triglycerides: 67 mg/dL (ref 0–149)
VLDL Cholesterol Cal: 12 mg/dL (ref 5–40)

## 2021-08-22 LAB — FOLLICLE STIMULATING HORMONE: FSH: 63 m[IU]/mL

## 2021-08-22 LAB — TSH: TSH: 1.38 u[IU]/mL (ref 0.450–4.500)

## 2021-08-22 LAB — HEPATITIS C ANTIBODY: Hep C Virus Ab: NONREACTIVE

## 2021-08-23 ENCOUNTER — Encounter: Payer: Self-pay | Admitting: Nurse Practitioner

## 2021-08-23 DIAGNOSIS — T3995XA Adverse effect of unspecified nonopioid analgesic, antipyretic and antirheumatic, initial encounter: Secondary | ICD-10-CM | POA: Insufficient documentation

## 2021-08-23 DIAGNOSIS — N3941 Urge incontinence: Secondary | ICD-10-CM | POA: Insufficient documentation

## 2021-08-23 DIAGNOSIS — R030 Elevated blood-pressure reading, without diagnosis of hypertension: Secondary | ICD-10-CM | POA: Insufficient documentation

## 2021-08-23 DIAGNOSIS — N950 Postmenopausal bleeding: Secondary | ICD-10-CM | POA: Insufficient documentation

## 2021-08-23 DIAGNOSIS — G4440 Drug-induced headache, not elsewhere classified, not intractable: Secondary | ICD-10-CM | POA: Insufficient documentation

## 2021-08-27 LAB — IGP, APTIMA HPV: HPV Aptima: NEGATIVE

## 2021-08-27 LAB — SPECIMEN STATUS REPORT

## 2021-09-03 ENCOUNTER — Telehealth: Payer: Self-pay | Admitting: *Deleted

## 2021-09-03 NOTE — Telephone Encounter (Signed)
Received fax from [facility] medicaid: ? ?Myrbetriq ER denied by insurance- non formulary medication -must try and fail 2 formulary medications that include: Oxybutynin tabs, Oxybutynin ER tabs, Solifenacin tabs or Toviaz tabs ?

## 2021-09-04 ENCOUNTER — Ambulatory Visit (HOSPITAL_COMMUNITY)
Admission: RE | Admit: 2021-09-04 | Discharge: 2021-09-04 | Disposition: A | Discharge status: Home / Self Care | Payer: Medicaid Other | Source: Ambulatory Visit | Attending: Nurse Practitioner | Admitting: Nurse Practitioner

## 2021-09-04 DIAGNOSIS — Z01419 Encounter for gynecological examination (general) (routine) without abnormal findings: Secondary | ICD-10-CM | POA: Diagnosis not present

## 2021-09-04 DIAGNOSIS — N888 Other specified noninflammatory disorders of cervix uteri: Secondary | ICD-10-CM | POA: Diagnosis not present

## 2021-09-04 DIAGNOSIS — N950 Postmenopausal bleeding: Secondary | ICD-10-CM | POA: Insufficient documentation

## 2021-09-13 ENCOUNTER — Encounter: Payer: Self-pay | Admitting: Nurse Practitioner

## 2021-09-14 NOTE — Telephone Encounter (Signed)
Nilda Simmer, NP   ? ?Please advise patient that we can switch to another medication. Just let me know. Thanks.    ? ?

## 2021-09-14 NOTE — Telephone Encounter (Signed)
Patient notified and stated she would like to try a different medication that is covered by insurance.  ? ?[facility] [place] ?

## 2021-09-17 ENCOUNTER — Telehealth: Payer: Self-pay | Admitting: Nurse Practitioner

## 2021-09-17 NOTE — Telephone Encounter (Signed)
error 

## 2021-09-18 ENCOUNTER — Other Ambulatory Visit: Payer: Self-pay | Admitting: Nurse Practitioner

## 2021-09-18 DIAGNOSIS — N950 Postmenopausal bleeding: Secondary | ICD-10-CM

## 2021-09-23 ENCOUNTER — Ambulatory Visit (HOSPITAL_COMMUNITY)
Admission: RE | Admit: 2021-09-23 | Discharge: 2021-09-23 | Disposition: A | Discharge status: Home / Self Care | Payer: Medicaid Other | Source: Ambulatory Visit | Attending: Nurse Practitioner | Admitting: Nurse Practitioner

## 2021-09-23 DIAGNOSIS — Z1231 Encounter for screening mammogram for malignant neoplasm of breast: Secondary | ICD-10-CM | POA: Diagnosis not present

## 2021-09-23 DIAGNOSIS — Z01419 Encounter for gynecological examination (general) (routine) without abnormal findings: Secondary | ICD-10-CM | POA: Diagnosis not present

## 2021-09-24 ENCOUNTER — Other Ambulatory Visit (HOSPITAL_COMMUNITY): Payer: Self-pay | Admitting: Nurse Practitioner

## 2021-09-24 DIAGNOSIS — R928 Other abnormal and inconclusive findings on diagnostic imaging of breast: Secondary | ICD-10-CM

## 2021-09-30 ENCOUNTER — Ambulatory Visit (HOSPITAL_COMMUNITY)
Admission: RE | Admit: 2021-09-30 | Discharge: 2021-09-30 | Disposition: A | Discharge status: Home / Self Care | Payer: Medicaid Other | Source: Ambulatory Visit | Attending: Nurse Practitioner | Admitting: Nurse Practitioner

## 2021-09-30 DIAGNOSIS — R928 Other abnormal and inconclusive findings on diagnostic imaging of breast: Secondary | ICD-10-CM | POA: Insufficient documentation

## 2021-09-30 DIAGNOSIS — R922 Inconclusive mammogram: Secondary | ICD-10-CM | POA: Diagnosis not present

## 2021-10-03 ENCOUNTER — Encounter: Payer: Self-pay | Admitting: Nurse Practitioner

## 2021-10-16 ENCOUNTER — Other Ambulatory Visit: Payer: Self-pay | Admitting: Nurse Practitioner

## 2021-10-16 MED ORDER — OXYBUTYNIN CHLORIDE ER 5 MG PO TB24: 5.0000 mg | ORAL_TABLET | Freq: Every day | ORAL | 0 refills | Status: DC

## 2021-10-19 ENCOUNTER — Ambulatory Visit: Payer: Medicaid Other | Admitting: Obstetrics & Gynecology

## 2021-10-19 ENCOUNTER — Encounter: Payer: Self-pay | Admitting: Obstetrics & Gynecology

## 2021-10-19 ENCOUNTER — Other Ambulatory Visit (HOSPITAL_COMMUNITY)
Admission: RE | Admit: 2021-10-19 | Discharge: 2021-10-19 | Disposition: A | Discharge status: Home / Self Care | Payer: Medicaid Other | Source: Ambulatory Visit | Attending: Obstetrics & Gynecology | Admitting: Obstetrics & Gynecology

## 2021-10-19 VITALS — BP 142/83 | HR 59 | Ht 67.0 in | Wt 165.2 lb

## 2021-10-19 DIAGNOSIS — N3942 Incontinence without sensory awareness: Secondary | ICD-10-CM | POA: Diagnosis not present

## 2021-10-19 DIAGNOSIS — N950 Postmenopausal bleeding: Secondary | ICD-10-CM | POA: Insufficient documentation

## 2021-10-19 DIAGNOSIS — N858 Other specified noninflammatory disorders of uterus: Secondary | ICD-10-CM | POA: Diagnosis not present

## 2021-10-19 MED ORDER — MIRABEGRON ER 25 MG PO TB24: 25.0000 mg | ORAL_TABLET | Freq: Every day | ORAL | 6 refills | Status: AC

## 2021-10-19 NOTE — Progress Notes (Signed)
GYN VISIT Patient name: Holly Little MRN [id number]  Date of birth: 03-Jun-1965 Chief Complaint:   New Patient (Initial Visit) and Urinary Incontinence  History of Present Illness:   Holly Little is a 56 y.o. [phone] PM female being seen today for the following concerns:  PMB: Back in March when she went for her annual visit, she had reported one day of light pink spotting.  Bleeding was very light and has had no bleeding since that time.  Pt was sent for [place]- records reviewed- they were unable to clearly identify the endometrium.  Urinary concerns: 4-5 mos notes uncontrolled urinary leakage.  Denies stress incontinence.  Currently voiding every hour +Nocturia-2x per night. Drinks some sweet tea- 2x per day, about a cup of coffee, no soda   No LMP recorded. Patient is postmenopausal.     10/19/2021   10:26 AM 08/21/2021    4:42 PM 08/21/2021    1:16 PM 01/19/2018   11:15 AM  Depression screen PHQ 2/9  Decreased Interest 0 1 0 0  Down, Depressed, Hopeless 1 0 0 1  PHQ - 2 Score 1 1 0 1  Altered sleeping '2 1  1  '$ Tired, decreased energy '1 1  1  '$ Change in appetite 0 0  0  Feeling bad or failure about yourself  0 1  1  Trouble concentrating 0 0  0  Moving slowly or fidgety/restless 0 0  0  Suicidal thoughts 0 0  0  PHQ-9 Score '4 4  4  '$ Difficult doing work/chores  Not difficult at all       Review of Systems:   Pertinent items are noted in HPI Denies fever/chills, dizziness, headaches, visual disturbances, fatigue, shortness of breath, chest pain, abdominal pain, vomiting. Pertinent History Reviewed:  Reviewed past medical,surgical, social, obstetrical and family history.  Reviewed problem list, medications and allergies. Physical Assessment:   Vitals:   10/19/21 1016  BP: (!) 142/83  Pulse: (!) 59  Weight: 165 lb 3.2 oz (74.9 kg)  Height: '5\' 7"'$  (1.702 m)  Body mass index is 25.87 kg/m.       Physical Examination:   General appearance: alert, well  appearing, and in no distress  Psych: mood appropriate, normal affect  Skin: warm & dry   Cardiovascular: RRR  Respiratory: normal respiratory effort, no distress  Abdomen: soft, non-tender   Pelvic: normal external genitalia, vulva, vagina, cervix, uterus and adnexa.  No prolapse appreciated.  No urethral hypermobility See below regarding procedures  Extremities: no edema   Chaperone: Marcelino Scot    Endometrial Biopsy Procedure Note  Pre-operative Diagnosis: Postmenopausal bleeding  Post-operative Diagnosis: same  Procedure Details   The risks (including infection, bleeding, pain, and uterine perforation) and benefits of the procedure were explained to the patient and Written informed consent was obtained.  Antibiotic prophylaxis against endocarditis was not indicated.   The patient was placed in the dorsal lithotomy position.  Bimanual exam showed the uterus to be in the neutral position.  A speculum inserted in the vagina, and the cervix prepped with betadine.     A single tooth tenaculum was applied to the anterior lip of the cervix for stabilization.  A Pipelle endometrial aspirator was used to sample the endometrium.  Sample was sent for pathologic examination.  Condition: Stable  Complications: None  Straight Catheterization Procedure for PVR: After verbal consent was obtained from the patient for catheterization to assess bladder emptying and residual volume the urethra and surrounding  tissues were prepped with betadine and an in and out catheterization was performed.  PVR was 50m.  Urine appeared clear yellow. The patient tolerated the procedure well.   Assessment & Plan:  1) Postmenopausal bleeding Next step pending results of pathology.   The patient was advised to call for any fever or for prolonged or severe pain or bleeding. She was advised to use OTC analgesics as needed for mild to moderate pain.    2) Urinary incontinence -normal PVR -UA and culture sent to  r/o underlying infection -discussed bladder training exercises -oxybutynin has been prescribed; however pt is not taking Discussed potential side effects of medication -Questions and concerns were addressed   Meds ordered this encounter  Medications   mirabegron ER (MYRBETRIQ) 25 MG TB24 tablet    Sig: Take 1 tablet (25 mg total) by mouth daily.    Dispense:  30 tablet    Refill:  6     Return in about 3 months (around 01/19/2022) for Medication follow up, with Dr. ONelda Marseille   JJanyth Pupa DO Attending ON[place] [facility]St[facility] Center[facility] [place]

## 2021-10-20 ENCOUNTER — Telehealth: Payer: Self-pay

## 2021-10-20 LAB — URINALYSIS, ROUTINE W REFLEX MICROSCOPIC
Bilirubin, UA: NEGATIVE
Glucose, UA: NEGATIVE
Ketones, UA: NEGATIVE
Leukocytes,UA: NEGATIVE
Nitrite, UA: NEGATIVE
Protein,UA: NEGATIVE
RBC, UA: NEGATIVE
Specific Gravity, UA: 1.016 (ref 1.005–1.030)
Urobilinogen, Ur: 0.2 mg/dL (ref 0.2–1.0)
pH, UA: 8 — ABNORMAL HIGH (ref 5.0–7.5)

## 2021-10-20 LAB — SURGICAL PATHOLOGY

## 2021-10-20 NOTE — Telephone Encounter (Signed)
PA for Myrbetriq approved, pt notified via Mychart, pt responded to msg.

## 2021-10-21 ENCOUNTER — Encounter: Payer: Self-pay | Admitting: Nurse Practitioner

## 2021-10-21 ENCOUNTER — Other Ambulatory Visit: Payer: Self-pay | Admitting: Obstetrics & Gynecology

## 2021-10-21 DIAGNOSIS — N3000 Acute cystitis without hematuria: Secondary | ICD-10-CM

## 2021-10-21 MED ORDER — NITROFURANTOIN MONOHYD MACRO 100 MG PO CAPS: 100.0000 mg | ORAL_CAPSULE | Freq: Two times a day (BID) | ORAL | 0 refills | Status: AC

## 2021-10-22 LAB — URINE CULTURE

## 2021-10-23 ENCOUNTER — Telehealth: Payer: Self-pay

## 2021-10-23 NOTE — Telephone Encounter (Signed)
-----   Message from Holly Pupa, DO sent at 10/22/2021 11:02 PM EDT ----- Please try to call patient with results of recent UTI.  An antibiotic has been called in

## 2021-10-23 NOTE — Telephone Encounter (Signed)
Called pt per Dr Nelda Marseille to review urine results and prescription. No answer, vm full. Pt saw Mychart msg from Dr Nelda Marseille.

## 2021-12-23 ENCOUNTER — Other Ambulatory Visit: Payer: Self-pay | Admitting: Nurse Practitioner

## 2022-01-25 ENCOUNTER — Ambulatory Visit: Payer: Medicaid Other | Admitting: Obstetrics & Gynecology

## 2022-01-25 ENCOUNTER — Encounter: Payer: Self-pay | Admitting: Obstetrics & Gynecology

## 2022-01-25 VITALS — BP 133/82 | HR 64 | Ht 67.0 in | Wt 162.0 lb

## 2022-01-25 DIAGNOSIS — N3941 Urge incontinence: Secondary | ICD-10-CM

## 2022-01-25 DIAGNOSIS — N3281 Overactive bladder: Secondary | ICD-10-CM

## 2022-01-25 NOTE — Progress Notes (Signed)
   GYN VISIT Patient name: [name] MRN [id number]  Date of birth: May 15, 1966 Chief Complaint:   OAB  History of Present Illness:   Holly Little is a 56 y.o. [id number] PM female being seen today for follow up regarding:   OAB- PA approved for myrbetriq.  She is taking the medication inconsistently as she finds it hard to take at night.  She does feel like when she remembers to take it, some improvement.  Still notes strong urgency.  Denies dysuria or hematuria.  Denies uncontrolled incontinence, just going to the bathroom often.  Denies stress incontinence.  Previously seen for PMB, no further vaginal bleeding.    No LMP recorded. Patient is postmenopausal.     10/19/2021   10:26 AM 08/21/2021    4:42 PM 08/21/2021    1:16 PM 01/19/2018   11:15 AM  Depression screen PHQ 2/9  Decreased Interest 0 1 0 0  Down, Depressed, Hopeless 1 0 0 1  PHQ - 2 Score 1 1 0 1  Altered sleeping '2 1  1  '$ Tired, decreased energy '1 1  1  '$ Change in appetite 0 0  0  Feeling bad or failure about yourself  0 1  1  Trouble concentrating 0 0  0  Moving slowly or fidgety/restless 0 0  0  Suicidal thoughts 0 0  0  PHQ-9 Score '4 4  4  '$ Difficult doing work/chores  Not difficult at all       Review of Systems:   Pertinent items are noted in HPI Denies fever/chills, dizziness, headaches, visual disturbances, fatigue, shortness of breath, chest pain, abdominal pain, vomiting, no problems with bowel movements. Pertinent History Reviewed:  Reviewed past medical,surgical, social, obstetrical and family history.  Reviewed problem list, medications and allergies. Physical Assessment:   Vitals:   01/25/22 0859  BP: 133/82  Pulse: 64  Weight: 162 lb (73.5 kg)  Height: '5\' 7"'$  (1.702 m)  Body mass index is 25.37 kg/m.       Physical Examination:   General appearance: alert, well appearing, and in no distress  Psych: mood appropriate, normal affect  Skin: warm & dry   Cardiovascular: normal heart  rate noted  Respiratory: normal respiratory effort, no distress  Pelvic: examination not indicated   Chaperone: N/A    Assessment & Plan:  1) OAB -ok to take myrbetriq in am, given another 1-2 mos trial -pt to call if not considerable improvement, '[]'$  plan to increase to '50mg'$  daily -f/u prn or 27yrfor anual   Return for Annual.   JJanyth Pupa DO Attending [place] F[place] [facility]

## 2022-10-21 ENCOUNTER — Ambulatory Visit: Payer: Medicaid Other | Admitting: Family Medicine

## 2022-10-21 VITALS — BP 120/78 | HR 76 | Temp 97.9°F | Ht 67.0 in | Wt 162.0 lb

## 2022-10-21 DIAGNOSIS — M6281 Muscle weakness (generalized): Secondary | ICD-10-CM | POA: Diagnosis not present

## 2022-10-21 DIAGNOSIS — R5383 Other fatigue: Secondary | ICD-10-CM | POA: Diagnosis not present

## 2022-10-21 NOTE — Assessment & Plan Note (Signed)
Etiology unclear at this time.  Laboratory studies for further workup today.  If negative, will proceed with sleep study.

## 2022-10-21 NOTE — Patient Instructions (Signed)
Labs today.  If normal, we will proceed with sleep study.  We will call with results.  Take care  Dr. Adriana Simas

## 2022-10-21 NOTE — Progress Notes (Signed)
Subjective:  Patient ID: Holly Little, female    DOB: 02-10-1966  Age: 57 y.o. MRN: [id number]  CC: Chief Complaint  Patient presents with   Fatigue   leg heaviness and sluggish     X  2 weeks    HPI:  57 year old female presents for evaluation of the above.  Patient reports that she is on the second week of her symptoms.  She reports generalized fatigue.  She feels exhausted.  She reports decreased energy.  She also reports that her legs feel heavy.  No reports of chest pain or shortness of breath.  No hematochezia or melena.  No new stressors.  She does note that she does not sleep very well.  She wakes up several times at night.  She works quite a bit.  She is unsure if she snores.  She has never had a sleep study.  She does report daytime somnolence.  Patient Active Problem List   Diagnosis Date Noted   Other fatigue 10/21/2022   Urge urinary incontinence 08/23/2021   Analgesic rebound headache 08/23/2021    Social Hx   Social History   Socioeconomic History   Marital status: Divorced    Spouse name: Not on file   Number of children: Not on file   Years of education: Not on file   Highest education level: Not on file  Occupational History   Not on file  Tobacco Use   Smoking status: Never   Smokeless tobacco: Never  Vaping Use   Vaping Use: Never used  Substance and Sexual Activity   Alcohol use: No   Drug use: No   Sexual activity: Not Currently    Birth control/protection: Post-menopausal  Other Topics Concern   Not on file  Social History Narrative   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   Social Determinants of Health   Financial Resource Strain: Low Risk  (10/19/2021)   Overall Financial Resource Strain (CARDIA)    Difficulty of Paying Living Expenses: Not very hard  Food Insecurity: Food Insecurity Present (10/19/2021)   Hunger Vital Sign    Worried About Running Out of Food in the Last Year: Sometimes  true    Ran Out of Food in the Last Year: Sometimes true  Transportation Needs: No Transportation Needs (10/19/2021)   PRAPARE - Administrator, Civil Service (Medical): No    Lack of Transportation (Non-Medical): No  Physical Activity: Insufficiently Active (10/19/2021)   Exercise Vital Sign    Days of Exercise per Week: 4 days    Minutes of Exercise per Session: 30 min  Stress: No Stress Concern Present (10/19/2021)   [employer] of Occupational Health - Occupational Stress Questionnaire    Feeling of Stress : Only a little  Social Connections: Moderately Integrated (10/19/2021)   Social Connection and Isolation Panel [NHANES]    Frequency of Communication with Friends and Family: More than three times a week    Frequency of Social Gatherings with Friends and Family: Once a week    Attends Religious Services: More than 4 times per year    Active Member of [employer] or Organizations: Yes    Attends Banker Meetings: 1 to 4 times per year    Marital Status: Divorced    Review of Systems Per HPI  Objective:  BP 120/78   Pulse 76   Temp 97.9 F (36.6 C)   Ht 5\' 7"  (1.702  m)   Wt 162 lb (73.5 kg)   SpO2 99%   BMI 25.37 kg/m      10/21/2022   11:34 AM 01/25/2022    8:59 AM 10/19/2021   10:16 AM  BP/Weight  Systolic BP 120 133 142  Diastolic BP 78 82 83  Wt. (Lbs) 162 162 165.2  BMI 25.37 kg/m2 25.37 kg/m2 25.87 kg/m2    Physical Exam Vitals and nursing note reviewed.  Constitutional:      General: She is not in acute distress.    Appearance: Normal appearance.  HENT:     Head: Normocephalic and atraumatic.  Eyes:     General:        Right eye: No discharge.        Left eye: No discharge.     Conjunctiva/sclera: Conjunctivae normal.  Cardiovascular:     Rate and Rhythm: Normal rate and regular rhythm.  Pulmonary:     Effort: Pulmonary effort is normal.     Breath sounds: Normal breath sounds. No wheezing, rhonchi or rales.  Neurological:      Mental Status: She is alert.  Psychiatric:        Mood and Affect: Mood normal.        Behavior: Behavior normal.     Lab Results  Component Value Date   WBC 5.7 08/21/2021   HGB 12.1 08/21/2021   HCT 35.9 08/21/2021   PLT 184 08/21/2021   GLUCOSE 69 (L) 08/21/2021   CHOL 213 (H) 08/21/2021   TRIG 67 08/21/2021   HDL 57 08/21/2021   LDLCALC 144 (H) 08/21/2021   ALT 23 08/21/2021   AST 26 08/21/2021   NA 146 (H) 08/21/2021   K 4.0 08/21/2021   CL 105 08/21/2021   CREATININE 0.68 08/21/2021   BUN 10 08/21/2021   CO2 23 08/21/2021   TSH 1.380 08/21/2021     Assessment & Plan:   Problem List Items Addressed This Visit       Other   Other fatigue - Primary    Etiology unclear at this time.  Laboratory studies for further workup today.  If negative, will proceed with sleep study.      Relevant Orders   CBC   CMP14+EGFR   TSH + free T4   T3, Free   Vitamin B12   Other Visit Diagnoses     Muscle weakness       Relevant Orders   CK       Follow-up: Pending workup  Vernis Eid Adriana Simas DO [facility]

## 2022-10-22 LAB — CMP14+EGFR
ALT: 14 IU/L (ref 0–32)
AST: 17 IU/L (ref 0–40)
Albumin/Globulin Ratio: 1.3 (ref 1.2–2.2)
Albumin: 4.3 g/dL (ref 3.8–4.9)
Alkaline Phosphatase: 61 IU/L (ref 44–121)
BUN/Creatinine Ratio: 13 (ref 9–23)
BUN: 9 mg/dL (ref 6–24)
Bilirubin Total: 0.3 mg/dL (ref 0.0–1.2)
CO2: 23 mmol/L (ref 20–29)
Calcium: 9.9 mg/dL (ref 8.7–10.2)
Chloride: 103 mmol/L (ref 96–106)
Creatinine, Ser: 0.71 mg/dL (ref 0.57–1.00)
Globulin, Total: 3.2 g/dL (ref 1.5–4.5)
Glucose: 81 mg/dL (ref 70–99)
Potassium: 4.2 mmol/L (ref 3.5–5.2)
Sodium: 143 mmol/L (ref 134–144)
Total Protein: 7.5 g/dL (ref 6.0–8.5)
eGFR: 98 mL/min/{1.73_m2} (ref >59)

## 2022-10-22 LAB — CBC
Hematocrit: 37.3 % (ref 34.0–46.6)
Hemoglobin: 12 g/dL (ref 11.1–15.9)
MCH: 28.2 pg (ref 26.6–33.0)
MCHC: 32.2 g/dL (ref 31.5–35.7)
MCV: 88 fL (ref 79–97)
Platelets: 200 10*3/uL (ref 150–450)
RBC: 4.25 x10E6/uL (ref 3.77–5.28)
RDW: 14.4 % (ref 11.7–15.4)
WBC: 6.6 10*3/uL (ref 3.4–10.8)

## 2022-10-22 LAB — TSH+FREE T4
Free T4: 1.17 ng/dL (ref 0.82–1.77)
TSH: 1.7 u[IU]/mL (ref 0.450–4.500)

## 2022-10-22 LAB — T3, FREE: T3, Free: 2.7 pg/mL (ref 2.0–4.4)

## 2022-10-22 LAB — CK: Total CK: 84 U/L (ref 32–182)

## 2022-10-22 LAB — VITAMIN B12: Vitamin B-12: 545 pg/mL (ref 232–1245)

## 2022-10-26 DIAGNOSIS — R5383 Other fatigue: Secondary | ICD-10-CM

## 2023-04-15 ENCOUNTER — Encounter: Payer: Self-pay | Admitting: *Deleted

## 2023-07-25 ENCOUNTER — Ambulatory Visit (INDEPENDENT_AMBULATORY_CARE_PROVIDER_SITE_OTHER): Payer: Medicaid Other | Admitting: Family Medicine

## 2023-07-25 ENCOUNTER — Other Ambulatory Visit (HOSPITAL_COMMUNITY): Admission: RE | Admit: 2023-07-25 | Payer: Medicaid Other | Source: Ambulatory Visit | Admitting: Family Medicine

## 2023-07-25 ENCOUNTER — Ambulatory Visit (HOSPITAL_COMMUNITY)
Admission: RE | Admit: 2023-07-25 | Discharge: 2023-07-25 | Disposition: A | Discharge status: Home / Self Care | Payer: Medicaid Other | Source: Ambulatory Visit | Attending: Family Medicine | Admitting: Family Medicine

## 2023-07-25 VITALS — BP 123/83 | HR 65 | Temp 97.3°F | Ht 67.0 in | Wt 160.0 lb

## 2023-07-25 DIAGNOSIS — M47816 Spondylosis without myelopathy or radiculopathy, lumbar region: Secondary | ICD-10-CM | POA: Diagnosis not present

## 2023-07-25 DIAGNOSIS — G8929 Other chronic pain: Secondary | ICD-10-CM | POA: Insufficient documentation

## 2023-07-25 DIAGNOSIS — M545 Low back pain, unspecified: Secondary | ICD-10-CM | POA: Insufficient documentation

## 2023-07-25 DIAGNOSIS — R200 Anesthesia of skin: Secondary | ICD-10-CM | POA: Diagnosis not present

## 2023-07-25 DIAGNOSIS — Z13220 Encounter for screening for lipoid disorders: Secondary | ICD-10-CM

## 2023-07-25 DIAGNOSIS — R0602 Shortness of breath: Secondary | ICD-10-CM | POA: Insufficient documentation

## 2023-07-25 DIAGNOSIS — R0789 Other chest pain: Secondary | ICD-10-CM

## 2023-07-25 DIAGNOSIS — M5030 Other cervical disc degeneration, unspecified cervical region: Secondary | ICD-10-CM | POA: Diagnosis not present

## 2023-07-25 DIAGNOSIS — M5136 Other intervertebral disc degeneration, lumbar region with discogenic back pain only: Secondary | ICD-10-CM | POA: Diagnosis not present

## 2023-07-25 DIAGNOSIS — I878 Other specified disorders of veins: Secondary | ICD-10-CM | POA: Diagnosis not present

## 2023-07-25 MED ORDER — METHOCARBAMOL 500 MG PO TABS: 500.0000 mg | ORAL_TABLET | Freq: Four times a day (QID) | ORAL | 1 refills | Status: AC | PRN

## 2023-07-25 NOTE — Progress Notes (Signed)
 Subjective:  Patient ID: Holly Little, female    DOB: 19-Feb-1966  Age: 58 y.o. MRN: [id number]  CC:   Chief Complaint  Patient presents with   Annual Exam    LL back pain and spasms x on and off, chest pressure and left arm numbness , headaches and sinuses draining     HPI:  58 year old female presents with multiple complaints today.  This was a scheduled to be a wellness exam but she has multiple issues at this time.  Patient reports intermittent headaches.  Patient also reports intermittent chest pressure.  She states that it has been occurring intermittently for the past 3 weeks.  Happens mostly with activity while she is at work.  Better when she is at rest and when she is calm.  Last for hours and then resolves spontaneously.  No reports of diaphoresis.  Patient does report some shortness of breath with physical activity.  Additionally, patient reports ongoing left arm numbness/paresthesia.  Patient reports that the numbness and paresthesias does always coincide with chest pressure.  She states that she does not feel that these are related.  Additionally, patient reports ongoing left-sided low back pain.  She states that she feels like she is experiencing spasms.  Patient Active Problem List   Diagnosis Date Noted   Chest pressure 07/25/2023   SOB (shortness of breath) 07/25/2023   Left arm numbness 07/25/2023   Chronic left-sided low back pain 07/25/2023   Urge urinary incontinence 08/23/2021    Social Hx   Social History   Socioeconomic History   Marital status: Divorced    Spouse name: Not on file   Number of children: Not on file   Years of education: Not on file   Highest education level: Not on file  Occupational History   Not on file  Tobacco Use   Smoking status: Never   Smokeless tobacco: Never  Vaping Use   Vaping status: Never Used  Substance and Sexual Activity   Alcohol use: No   Drug use: No   Sexual activity: Not Currently    Birth  control/protection: Post-menopausal  Other Topics Concern   Not on file  Social History Narrative   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   Social Drivers of Corporate investment banker Strain: Low Risk  (10/19/2021)   Overall Financial Resource Strain (CARDIA)    Difficulty of Paying Living Expenses: Not very hard  Food Insecurity: Food Insecurity Present (10/19/2021)   Hunger Vital Sign    Worried About Running Out of Food in the Last Year: Sometimes true    Ran Out of Food in the Last Year: Sometimes true  Transportation Needs: No Transportation Needs (10/19/2021)   PRAPARE - Administrator, Civil Service (Medical): No    Lack of Transportation (Non-Medical): No  Physical Activity: Insufficiently Active (10/19/2021)   Exercise Vital Sign    Days of Exercise per Week: 4 days    Minutes of Exercise per Session: 30 min  Stress: No Stress Concern Present (10/19/2021)   [employer] of Occupational Health - Occupational Stress Questionnaire    Feeling of Stress : Only a little  Social Connections: Moderately Integrated (10/19/2021)   Social Connection and Isolation Panel [NHANES]    Frequency of Communication with Friends and Family: More than three times a week    Frequency of Social Gatherings with Friends and Family: Once a week  Attends Religious Services: More than 4 times per year    Active Member of Clubs or Organizations: Yes    Attends Banker Meetings: 1 to 4 times per year    Marital Status: Divorced    Review of Systems Per HPI  Objective:  BP 123/83   Pulse 65   Temp (!) 97.3 F (36.3 C)   Ht 5\' 7"  (1.702 m)   Wt 160 lb (72.6 kg)   SpO2 100%   BMI 25.06 kg/m      07/25/2023   10:37 AM 10/21/2022   11:34 AM 01/25/2022    8:59 AM  BP/Weight  Systolic BP 123 120 133  Diastolic BP 83 78 82  Wt. (Lbs) 160 162 162  BMI 25.06 kg/m2 25.37 kg/m2 25.37 kg/m2    Physical Exam Constitutional:       General: She is not in acute distress.    Appearance: Normal appearance.  HENT:     Head: Normocephalic and atraumatic.  Eyes:     General:        Right eye: No discharge.        Left eye: No discharge.     Conjunctiva/sclera: Conjunctivae normal.  Cardiovascular:     Rate and Rhythm: Normal rate and regular rhythm.  Pulmonary:     Effort: Pulmonary effort is normal.     Breath sounds: Normal breath sounds. No wheezing, rhonchi or rales.  Abdominal:     General: There is no distension.     Palpations: Abdomen is soft.     Tenderness: There is no abdominal tenderness.  Neurological:     Mental Status: She is alert.     Comments: Normal handgrip strength.  Normal strength of the upper extremities.     Lab Results  Component Value Date   WBC 6.6 10/21/2022   HGB 12.0 10/21/2022   HCT 37.3 10/21/2022   PLT 200 10/21/2022   GLUCOSE 81 10/21/2022   CHOL 213 (H) 08/21/2021   TRIG 67 08/21/2021   HDL 57 08/21/2021   LDLCALC 144 (H) 08/21/2021   ALT 14 10/21/2022   AST 17 10/21/2022   NA 143 10/21/2022   K 4.2 10/21/2022   CL 103 10/21/2022   CREATININE 0.71 10/21/2022   BUN 9 10/21/2022   CO2 23 10/21/2022   TSH 1.700 10/21/2022     Assessment & Plan:  Chest pressure Assessment & Plan: EKG was obtained today.  Interpretation: Sinus bradycardia at the rate of 57.  Normal axis.  Normal intervals.  No ST-T wave changes.  Normal EKG. Chest pain is fairly atypical.  Nevertheless, patient needs further evaluation.  Referring to cardiology.  Labs today.  Orders: -     EKG 12-Lead -     Ambulatory referral to Cardiology -     CMP14+EGFR  Screening, lipid -     Lipid panel  SOB (shortness of breath) -     CBC  Left arm numbness Assessment & Plan: Concern for cervical radiculopathy.  X-ray of the cervical spine today.  Orders: -     DG Cervical Spine Complete  Chronic left-sided low back pain, unspecified whether sciatica present Assessment & Plan: X-ray for  further evaluation.  Methocarbamol as prescribed.  Orders: -     DG Lumbar Spine Complete  Other orders -     Methocarbamol; Take 1 tablet (500 mg total) by mouth every 6 (six) hours as needed for muscle spasms.  Dispense: 90 tablet; Refill: 1  Follow-up:  Return in about 3 months (around 10/22/2023).  Everlene Other DO [facility]

## 2023-07-25 NOTE — Assessment & Plan Note (Signed)
 EKG was obtained today.  Interpretation: Sinus bradycardia at the rate of 57.  Normal axis.  Normal intervals.  No ST-T wave changes.  Normal EKG. Chest pain is fairly atypical.  Nevertheless, patient needs further evaluation.  Referring to cardiology.  Labs today.

## 2023-07-25 NOTE — Assessment & Plan Note (Signed)
 Concern for cervical radiculopathy.  X-ray of the cervical spine today.

## 2023-07-25 NOTE — Patient Instructions (Signed)
 Xrays at the hospital.  Labs today.  Medication as prescribed.  Referral placed.

## 2023-07-25 NOTE — Assessment & Plan Note (Signed)
 X-ray for further evaluation.  Methocarbamol as prescribed.

## 2023-07-26 ENCOUNTER — Encounter: Payer: Self-pay | Admitting: Family Medicine

## 2023-07-26 ENCOUNTER — Other Ambulatory Visit: Payer: Self-pay | Admitting: Family Medicine

## 2023-07-26 LAB — CBC
Hematocrit: 40.1 % (ref 34.0–46.6)
Hemoglobin: 12.3 g/dL (ref 11.1–15.9)
MCH: 27.8 pg (ref 26.6–33.0)
MCHC: 30.7 g/dL — ABNORMAL LOW (ref 31.5–35.7)
MCV: 91 fL (ref 79–97)
Platelets: 185 10*3/uL (ref 150–450)
RBC: 4.43 x10E6/uL (ref 3.77–5.28)
RDW: 13.8 % (ref 11.7–15.4)
WBC: 5.6 10*3/uL (ref 3.4–10.8)

## 2023-07-26 LAB — CMP14+EGFR
ALT: 19 [IU]/L (ref 0–32)
AST: 24 [IU]/L (ref 0–40)
Albumin: 4.5 g/dL (ref 3.8–4.9)
Alkaline Phosphatase: 66 [IU]/L (ref 44–121)
BUN/Creatinine Ratio: 17 (ref 9–23)
BUN: 11 mg/dL (ref 6–24)
Bilirubin Total: 0.3 mg/dL (ref 0.0–1.2)
CO2: 26 mmol/L (ref 20–29)
Calcium: 9.9 mg/dL (ref 8.7–10.2)
Chloride: 101 mmol/L (ref 96–106)
Creatinine, Ser: 0.63 mg/dL (ref 0.57–1.00)
Globulin, Total: 2.9 g/dL (ref 1.5–4.5)
Glucose: 70 mg/dL (ref 70–99)
Potassium: 4.4 mmol/L (ref 3.5–5.2)
Sodium: 143 mmol/L (ref 134–144)
Total Protein: 7.4 g/dL (ref 6.0–8.5)
eGFR: 102 mL/min/{1.73_m2} (ref >59)

## 2023-07-26 LAB — LIPID PANEL
Chol/HDL Ratio: 5.5 {ratio} — ABNORMAL HIGH (ref 0.0–4.4)
Cholesterol, Total: 219 mg/dL — ABNORMAL HIGH (ref 100–199)
HDL: 40 mg/dL (ref >39)
LDL Chol Calc (NIH): 157 mg/dL — ABNORMAL HIGH (ref 0–99)
Triglycerides: 122 mg/dL (ref 0–149)
VLDL Cholesterol Cal: 22 mg/dL (ref 5–40)

## 2023-07-26 MED ORDER — ROSUVASTATIN CALCIUM 10 MG PO TABS: 10.0000 mg | ORAL_TABLET | Freq: Every day | ORAL | 3 refills | Status: DC

## 2023-08-09 ENCOUNTER — Other Ambulatory Visit: Payer: Self-pay | Admitting: Family Medicine
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
## 2023-10-20 ENCOUNTER — Ambulatory Visit: Payer: Medicaid Other | Admitting: Cardiovascular Disease

## 2023-10-20 ENCOUNTER — Encounter: Payer: Self-pay | Admitting: Cardiovascular Disease

## 2023-10-20 ENCOUNTER — Other Ambulatory Visit (HOSPITAL_COMMUNITY)
Admission: RE | Admit: 2023-10-20 | Discharge: 2023-10-20 | Disposition: A | Discharge status: Home / Self Care | Source: Ambulatory Visit | Attending: Cardiovascular Disease | Admitting: Cardiovascular Disease

## 2023-10-20 ENCOUNTER — Ambulatory Visit: Payer: Medicaid Other | Admitting: Family Medicine

## 2023-10-20 ENCOUNTER — Telehealth: Payer: Self-pay | Admitting: Cardiovascular Disease

## 2023-10-20 ENCOUNTER — Ambulatory Visit: Payer: Self-pay | Admitting: Cardiovascular Disease

## 2023-10-20 VITALS — BP 130/82 | HR 78 | Ht 67.0 in | Wt 160.0 lb

## 2023-10-20 DIAGNOSIS — R0789 Other chest pain: Secondary | ICD-10-CM | POA: Diagnosis not present

## 2023-10-20 DIAGNOSIS — Z136 Encounter for screening for cardiovascular disorders: Secondary | ICD-10-CM

## 2023-10-20 LAB — BASIC METABOLIC PANEL WITH GFR
Anion gap: 7 (ref 5–15)
BUN: 7 mg/dL (ref 6–20)
CO2: 28 mmol/L (ref 22–32)
Calcium: 9.4 mg/dL (ref 8.9–10.3)
Chloride: 103 mmol/L (ref 98–111)
Creatinine, Ser: 0.68 mg/dL (ref 0.44–1.00)
GFR, Estimated: >60 mL/min (ref >60)
Glucose, Bld: 78 mg/dL (ref 70–99)
Potassium: 3.5 mmol/L (ref 3.5–5.1)
Sodium: 138 mmol/L (ref 135–145)

## 2023-10-20 LAB — LIPID PANEL
Cholesterol: 196 mg/dL (ref 0–200)
HDL: 35 mg/dL — ABNORMAL LOW (ref >40)
LDL Cholesterol: 134 mg/dL — ABNORMAL HIGH (ref 0–99)
Total CHOL/HDL Ratio: 5.6 ratio
Triglycerides: 137 mg/dL (ref <150)
VLDL: 27 mg/dL (ref 0–40)

## 2023-10-20 LAB — HEPATIC FUNCTION PANEL
ALT: 14 U/L (ref 0–44)
AST: 16 U/L (ref 15–41)
Albumin: 3.8 g/dL (ref 3.5–5.0)
Alkaline Phosphatase: 53 U/L (ref 38–126)
Bilirubin, Direct: <0.1 mg/dL (ref 0.0–0.2)
Total Bilirubin: 0.7 mg/dL (ref 0.0–1.2)
Total Protein: 7.7 g/dL (ref 6.5–8.1)

## 2023-10-20 MED ORDER — METOPROLOL TARTRATE 25 MG PO TABS
ORAL_TABLET | ORAL | 0 refills | Status: AC
Start: 2023-10-20 — End: ?

## 2023-10-20 NOTE — Patient Instructions (Signed)
 Medication Instructions:   Your physician recommends that you continue on your current medications as directed. Please refer to the Current Medication list given to you today.  Take Lopressor 25 mg 2 hours before coronary ct   Labwork: BMET,Lipids,LFT's today  Testing/Procedures: Your physician has requested that you have an echocardiogram. Echocardiography is a painless test that uses sound waves to create images of your heart. It provides your doctor with information about the size and shape of your heart and how well your heart's chambers and valves are working. This procedure takes approximately one hour. There are no restrictions for this procedure. Please do NOT wear cologne, perfume, aftershave, or lotions (deodorant is allowed). Please arrive 15 minutes prior to your appointment time.  Please note: We ask at that you not bring children with you during ultrasound (echo/ vascular) testing. Due to room size and safety concerns, children are not allowed in the ultrasound rooms during exams. Our front office staff cannot provide observation of children in our lobby area while testing is being conducted. An adult accompanying a patient to their appointment will only be allowed in the ultrasound room at the discretion of the ultrasound technician under special circumstances. We apologize for any inconvenience.         Your cardiac CT will be scheduled at one of the below locations:   [facility] [address] [place], [place] [address] [phone]  OR  [facility] [address] [place], [place] [address] [phone]  OR   [facility] [address] [place], [place] [address] [phone]  OR   [facility] [address] [place], [place] [address] [phone]  OR   [facility] [address]  [place], [place] [address] Opening  September 26, 2023  If scheduled at [facility], please arrive at the [facility] and Children's Entrance (Entrance C2) of [facility] 30 minutes prior to test start time. You can use the FREE valet parking offered at entrance C (encouraged to control the heart rate for the test)  Proceed to the [facility] Radiology Department (first floor) to check-in and test prep.   All radiology patients and guests should use entrance C2 at [facility], accessed from [facility], even though the hospital's physical address listed is [address]    If scheduled at the Heart and Vascular Tower at [address], please enter the parking lot using the [address] entrance and use the FREE valet service at the patient drop-off area. Enter the buidling and check-in with registration on the main floor.  If scheduled at [facility] or [facility], please arrive 15 mins early for check-in and test prep.  There is spacious parking and easy access to the radiology department from the [facility] Heart and Vascular entrance. Please enter here and check-in with the desk attendant.   If scheduled at [facility], please arrive 30 minutes early for check-in and test prep.  Please follow these instructions carefully (unless otherwise directed):  An IV will be required for this test and Nitroglycerin will be given.  Hold all erectile dysfunction medications at least 3 days (72 hrs) prior to test. (Ie viagra, cialis, sildenafil, tadalafil, etc)   On the Night Before the Test: Be sure to Drink plenty of water . Do not consume any caffeinated/decaffeinated beverages or chocolate 12 hours prior to  your test. Do not take any antihistamines 12 hours prior to your test.  On the Day of the Test: Drink plenty of water  until 1 hour prior to the test. Do not eat any food 1 hour prior to test. You may take your regular medications  prior to the test.  Take metoprolol (Lopressor) two hours prior to test. If you take Furosemide/Hydrochlorothiazide/Spironolactone/Chlorthalidone, please HOLD on the morning of the test. Patients who wear a continuous glucose monitor MUST remove the device prior to scanning. FEMALES- please wear underwire-free bra if available, avoid dresses & tight clothing       After the Test: Drink plenty of water . After receiving IV contrast, you may experience a mild flushed feeling. This is normal. On occasion, you may experience a mild rash up to 24 hours after the test. This is not dangerous. If this occurs, you can take Benadryl 25 mg, Zyrtec, Claritin, or Allegra and increase your fluid intake. (Patients taking Tikosyn should avoid Benadryl, and may take Zyrtec, Claritin, or Allegra) If you experience trouble breathing, this can be serious. If it is severe call 911 IMMEDIATELY. If it is mild, please call our office.  We will call to schedule your test 2-4 weeks out understanding that some insurance companies will need an authorization prior to the service being performed.   For more information and frequently asked questions, please visit our website : [email or web address]  For non-scheduling related questions, please contact the cardiac imaging nurse navigator should you have any questions/concerns: Cardiac Imaging Nurse Navigators Direct Office Dial: [phone]   For scheduling needs, including cancellations and rescheduling, please call [place], [phone].   Follow-Up: To be determined after testing  Any Other Special Instructions Will Be Listed Below (If Applicable).  If you need a refill on your cardiac medications before your next appointment, please call your pharmacy.

## 2023-10-20 NOTE — Telephone Encounter (Signed)
 Patient was returning call for results. Please advise

## 2023-10-20 NOTE — Telephone Encounter (Signed)
 Left message for patient to return the call.

## 2023-10-21 MED ORDER — ROSUVASTATIN CALCIUM 20 MG PO TABS: 20.0000 mg | ORAL_TABLET | Freq: Every day | ORAL | 3 refills | Status: AC

## 2023-10-21 NOTE — Telephone Encounter (Signed)
 Follow Up:     Patient  says she is returning a call from [place].

## 2023-10-21 NOTE — Telephone Encounter (Signed)
-----   Message from Nurse Daria Eddy sent at 10/21/2023  1:38 PM EDT -----  ----- Message ----- From: Shela Derby Sent: 10/21/2023  12:32 PM EDT To: [facility] Gu Triage

## 2023-10-21 NOTE — Telephone Encounter (Signed)
 The patient has been notified of the result and verbalized understanding.  All questions (if any) were answered. Casper Clement, CMA 10/21/2023 1:46 PM     Per Dr. Stann Earnest:    10/20/23 12:29 PM Result Note LDL still pretty high 134 increase crestor  20 mg and repeat labs in 8 weeks BMET fine for CTA

## 2023-10-26 ENCOUNTER — Encounter: Payer: Self-pay | Admitting: Family Medicine

## 2023-10-26 ENCOUNTER — Encounter (INDEPENDENT_AMBULATORY_CARE_PROVIDER_SITE_OTHER): Payer: Self-pay | Admitting: *Deleted

## 2023-10-26 ENCOUNTER — Ambulatory Visit: Admitting: Family Medicine

## 2023-10-26 VITALS — BP 126/84 | HR 62 | Temp 98.2°F | Ht 67.0 in | Wt 163.0 lb

## 2023-10-26 DIAGNOSIS — Z23 Encounter for immunization: Secondary | ICD-10-CM

## 2023-10-26 DIAGNOSIS — Z0000 Encounter for general adult medical examination without abnormal findings: Secondary | ICD-10-CM | POA: Diagnosis not present

## 2023-10-26 DIAGNOSIS — Z1211 Encounter for screening for malignant neoplasm of colon: Secondary | ICD-10-CM

## 2023-10-26 NOTE — Patient Instructions (Signed)
 Call [phone] to schedule mammogram.  Referral to GI placed.  Follow up annually.  Take care  Dr. Debrah Fan

## 2023-10-26 NOTE — Progress Notes (Signed)
 Subjective:  Patient ID: Holly Little, female    DOB: [birth year]  Age: 58 y.o. MRN: [id number]  CC:  Physical exam  HPI:  58 year old female presents for a physical exam.  Patient states that overall she is doing well.  She is working a lot.  She states that her daughter is given a to go off to college.  She is slightly upset about this as the house will be empty.  Patient's blood pressure is well-controlled.  Patient is overdue for several preventative healthcare items.  Due for colonoscopy.  Amenable to referral.  Needs mammogram.  Will give information regarding this.  Patient is amenable to getting Tdap today.  Also needs shingles vaccine.  Will discuss.  Patient Active Problem List   Diagnosis Date Noted   Annual physical exam 10/26/2023   Left arm numbness 07/25/2023   Chronic left-sided low back pain 07/25/2023   Urge urinary incontinence 08/23/2021    Social Hx   Social History   Socioeconomic History   Marital status: Divorced    Spouse name: Not on file   Number of children: Not on file   Years of education: Not on file   Highest education level: Not on file  Occupational History   Not on file  Tobacco Use   Smoking status: Never   Smokeless tobacco: Never  Vaping Use   Vaping status: Never Used  Substance and Sexual Activity   Alcohol use: No   Drug use: No   Sexual activity: Not Currently    Birth control/protection: Post-menopausal  Other Topics Concern   Not on file  Social History Narrative   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   Social Drivers of Corporate investment banker Strain: Low Risk  (10/19/2021)   Overall Financial Resource Strain (CARDIA)    Difficulty of Paying Living Expenses: Not very hard  Food Insecurity: Food Insecurity Present (10/19/2021)   Hunger Vital Sign    Worried About Running Out of Food in the Last Year: Sometimes true    Ran Out of Food in the Last Year: Sometimes true   Transportation Needs: No Transportation Needs (10/19/2021)   PRAPARE - Administrator, Civil Service (Medical): No    Lack of Transportation (Non-Medical): No  Physical Activity: Insufficiently Active (10/19/2021)   Exercise Vital Sign    Days of Exercise per Week: 4 days    Minutes of Exercise per Session: 30 min  Stress: No Stress Concern Present (10/19/2021)   [employer] of Occupational Health - Occupational Stress Questionnaire    Feeling of Stress : Only a little  Social Connections: Moderately Integrated (10/19/2021)   Social Connection and Isolation Panel [NHANES]    Frequency of Communication with Friends and Family: More than three times a week    Frequency of Social Gatherings with Friends and Family: Once a week    Attends Religious Services: More than 4 times per year    Active Member of [employer] or Organizations: Yes    Attends Banker Meetings: 1 to 4 times per year    Marital Status: Divorced    Review of Systems  Constitutional: Negative.   Respiratory: Negative.     Objective:  BP 126/84   Pulse 62   Temp 98.2 F (36.8 C)   Ht 5\' 7"  (1.702 m)   Wt 163 lb (73.9 kg)   SpO2 99%   BMI 25.53 kg/m  10/26/2023   10:30 AM 10/20/2023   10:40 AM 07/25/2023   10:37 AM  BP/Weight  Systolic BP 126 130 123  Diastolic BP 84 82 83  Wt. (Lbs) 163 160 160  BMI 25.53 kg/m2 25.06 kg/m2 25.06 kg/m2    Physical Exam Vitals and nursing note reviewed.  Constitutional:      General: She is not in acute distress.    Appearance: Normal appearance.  HENT:     Head: Normocephalic and atraumatic.  Eyes:     General:        Right eye: No discharge.        Left eye: No discharge.     Conjunctiva/sclera: Conjunctivae normal.  Cardiovascular:     Rate and Rhythm: Normal rate and regular rhythm.  Pulmonary:     Effort: Pulmonary effort is normal.     Breath sounds: Normal breath sounds. No wheezing, rhonchi or rales.  Neurological:     Mental  Status: She is alert.  Psychiatric:        Mood and Affect: Mood normal.        Behavior: Behavior normal.     Lab Results  Component Value Date   WBC 5.6 07/25/2023   HGB 12.3 07/25/2023   HCT 40.1 07/25/2023   PLT 185 07/25/2023   GLUCOSE 78 10/20/2023   CHOL 196 10/20/2023   TRIG 137 10/20/2023   HDL 35 (L) 10/20/2023   LDLCALC 134 (H) 10/20/2023   ALT 14 10/20/2023   AST 16 10/20/2023   NA 138 10/20/2023   K 3.5 10/20/2023   CL 103 10/20/2023   CREATININE 0.68 10/20/2023   BUN 7 10/20/2023   CO2 28 10/20/2023   TSH 1.700 10/21/2022     Assessment & Plan:  Annual physical exam Assessment & Plan: Overall doing well.  Cardiology has ordered a CT coronary. Tdap given today. Patient has had recent labs. Referral placed for colonoscopy. Patient given information regarding number to call to schedule mammogram. Advised that she can get shingles vaccine at her pharmacy if she desires.   Encounter for screening colonoscopy -     Ambulatory referral to Gastroenterology  Immunization due -     Tdap vaccine greater than or equal to 7yo IM    Follow-up:  Annually  Kathleen Papa DO [facility]

## 2023-10-26 NOTE — Assessment & Plan Note (Signed)
 Overall doing well.  Cardiology has ordered a CT coronary. Tdap given today. Patient has had recent labs. Referral placed for colonoscopy. Patient given information regarding number to call to schedule mammogram. Advised that she can get shingles vaccine at her pharmacy if she desires.

## 2023-11-01 NOTE — Telephone Encounter (Signed)
 Casper Clement, CMA 10/21/2023  1:44 PM EDT Back to Top    The patient has been notified of the result and verbalized understanding.  All questions (if any) were answered. Casper Clement, CMA 10/21/2023 1:44 PM

## 2023-11-10 ENCOUNTER — Encounter (HOSPITAL_COMMUNITY): Payer: Self-pay

## 2023-11-11 ENCOUNTER — Telehealth (HOSPITAL_COMMUNITY): Payer: Self-pay | Admitting: Emergency Medicine

## 2023-11-11 NOTE — Telephone Encounter (Signed)
 Attempted to call patient regarding upcoming cardiac CT appointment. Left message on voicemail with name and callback number Rockwell Alexandria RN Navigator Cardiac [facility] [phone] Office [phone] Cell

## 2023-11-14 ENCOUNTER — Ambulatory Visit (HOSPITAL_COMMUNITY)
Admission: RE | Admit: 2023-11-14 | Discharge: 2023-11-14 | Discharge status: Home / Self Care | Source: Ambulatory Visit | Attending: Cardiovascular Disease | Admitting: Cardiovascular Disease

## 2023-11-14 DIAGNOSIS — I2510 Atherosclerotic heart disease of native coronary artery without angina pectoris: Secondary | ICD-10-CM | POA: Diagnosis not present

## 2023-11-14 DIAGNOSIS — R0789 Other chest pain: Secondary | ICD-10-CM | POA: Diagnosis not present

## 2023-11-14 MED ORDER — NITROGLYCERIN 0.4 MG SL SUBL
0.8000 mg | SUBLINGUAL_TABLET | Freq: Once | SUBLINGUAL | Status: AC
  Administered 2023-11-14: 0.8 mg via SUBLINGUAL

## 2023-11-14 MED ORDER — NITROGLYCERIN 0.4 MG SL SUBL
SUBLINGUAL_TABLET | SUBLINGUAL | Status: AC
  Filled 2023-11-14: qty 2

## 2023-11-14 MED ORDER — IOHEXOL 350 MG/ML SOLN
100.0000 mL | Freq: Once | INTRAVENOUS | Status: AC | PRN
  Administered 2023-11-14: 100 mL via INTRAVENOUS

## 2023-11-25 ENCOUNTER — Ambulatory Visit (HOSPITAL_COMMUNITY)
Admission: RE | Admit: 2023-11-25 | Discharge: 2023-11-25 | Disposition: A | Discharge status: Home / Self Care | Source: Ambulatory Visit | Attending: Cardiovascular Disease | Admitting: Cardiovascular Disease

## 2023-11-25 DIAGNOSIS — R0789 Other chest pain: Secondary | ICD-10-CM | POA: Diagnosis not present

## 2023-11-25 LAB — ECHOCARDIOGRAM COMPLETE
AR max vel: 2.49 cm2
AV Area VTI: 2.28 cm2
AV Area mean vel: 2.22 cm2
AV Mean grad: 4 mmHg
AV Peak grad: 6.2 mmHg
Ao pk vel: 1.24 m/s
Area-P 1/2: 3.24 cm2
S' Lateral: 2.9 cm

## 2024-04-05 ENCOUNTER — Encounter (HOSPITAL_COMMUNITY): Payer: Self-pay | Admitting: Emergency Medicine

## 2024-04-05 ENCOUNTER — Emergency Department (HOSPITAL_COMMUNITY)

## 2024-04-05 ENCOUNTER — Ambulatory Visit: Payer: Self-pay | Admitting: *Deleted

## 2024-04-05 ENCOUNTER — Emergency Department (HOSPITAL_COMMUNITY)
Admission: EM | Admit: 2024-04-05 | Discharge: 2024-04-05 | Disposition: A | Discharge status: Home / Self Care | Attending: Emergency Medicine | Admitting: Emergency Medicine

## 2024-04-05 ENCOUNTER — Other Ambulatory Visit: Payer: Self-pay

## 2024-04-05 DIAGNOSIS — M79605 Pain in left leg: Secondary | ICD-10-CM | POA: Diagnosis present

## 2024-04-05 DIAGNOSIS — M25572 Pain in left ankle and joints of left foot: Secondary | ICD-10-CM | POA: Diagnosis not present

## 2024-04-05 DIAGNOSIS — M7662 Achilles tendinitis, left leg: Secondary | ICD-10-CM | POA: Diagnosis not present

## 2024-04-05 DIAGNOSIS — M79662 Pain in left lower leg: Secondary | ICD-10-CM | POA: Diagnosis not present

## 2024-04-05 MED ORDER — IBUPROFEN 800 MG PO TABS
800.0000 mg | ORAL_TABLET | Freq: Once | ORAL | Status: AC
  Administered 2024-04-05: 800 mg via ORAL
  Filled 2024-04-05: qty 1

## 2024-04-05 NOTE — Telephone Encounter (Signed)
 No available OV within 4 hours . Recommended ED for evaluation due to symptoms.     FYI Only or Action Required?: FYI only for provider: ED advised.  Patient was last seen in primary care on 10/26/2023 by Cook, Jayce G, DO.  Called Nurse Triage reporting Pain.  Symptoms began yesterday.  Interventions attempted: Nothing.  Symptoms are: gradually worsening.  Triage Disposition: See HCP Within 4 Hours (Or PCP Triage)  Patient/caregiver understands and will follow disposition?: Yes        Copied from CRM [id number]. Topic: Clinical - Red Word Triage >> Apr 05, 2024  8:02 AM Charlet HERO wrote: Red Word that prompted transfer to Nurse Triage: Patient is calling about left leg has been swollen and she elevated it last night and it has went down and she is experiencing pain sharp pain. Cook Reason for Disposition  [1] Thigh, calf, or ankle swelling AND [2] only 1 side  Answer Assessment - Initial Assessment Questions Recommended ED now . No available appt with PCP or other providers today.       1. ONSET: When did the swelling start? (e.g., minutes, hours, days)     Yesterday worsening happened all week  2. LOCATION: What part of the leg is swollen?  Are both legs swollen or just one leg?     Ankle to knee  3. SEVERITY: How bad is the swelling? (e.g., localized; mild, moderate, severe)     Mild now  4. REDNESS: Is there redness or signs of infection?     Last night felt warm to touch, none today  5. PAIN: Is the swelling painful to touch? If Yes, ask: How painful is it?   (Scale 1-10; mild, moderate or severe)     Mild at rest  6. FEVER: Do you have a fever? If Yes, ask: What is it, how was it measured, and when did it start?      na 7. CAUSE: What do you think is causing the leg swelling?     Not sure  8. MEDICAL HISTORY: Do you have a history of blood clots (e.g., DVT), cancer, heart failure, kidney disease, or liver failure?     Na 9. RECURRENT  SYMPTOM: Have you had leg swelling before? If Yes, ask: When was the last time? What happened that time?     No  10. OTHER SYMPTOMS: Do you have any other symptoms? (e.g., chest pain, difficulty breathing)       Felt like a cramp then started swelling  11. PREGNANCY: Is there any chance you are pregnant? When was your last menstrual period?       na  Protocols used: Leg Swelling and Edema-A-AH

## 2024-04-05 NOTE — Discharge Instructions (Addendum)
 Evaluation was overall reassuring.  Your x-rays were negative.  Would recommend conservative treatment at home which includes applying ice 3-4 times a day, compression with an Ace wrap, ibuprofen  as needed and you can also take Tylenol .  Please follow-up your PCP.

## 2024-04-05 NOTE — ED Triage Notes (Signed)
 Pt reports lower left anterior leg pain that started 2 days ago. Pt reports the pain became worse last night. Denies injury to the leg.

## 2024-04-05 NOTE — ED Provider Notes (Signed)
 [place] EMERGENCY DEPARTMENT AT [facility] Provider Note   CSN: [id number] Arrival date & time: 04/05/24  0940     Patient presents with: Leg Pain  HPI Holly Little is a 58 y.o. female presenting for atraumatic left anterior leg pain.  Started 2 days ago.  Worse with walking.  Pain is at the anterior distal tib-fib and radiates to the left lateral ankle.  States she noted some swelling last night in that area.  Last took ibuprofen  this morning.  Denies any calf tenderness.  Denies chest pain or shortness of breath.  Denies fever.    Leg Pain      Prior to Admission medications   Medication Sig Start Date End Date Taking? Authorizing Provider  APPLE CIDER VINEGAR PO Take 450 mg by mouth daily after lunch.    [provider]  Biotin 89999 MCG TABS Take 10,000 mcg by mouth daily after lunch.    [provider]  cholecalciferol (VITAMIN D) 25 MCG (1000 UT) tablet Take 1,000 Units by mouth daily after lunch.    [provider]  meloxicam  (MOBIC ) 15 MG tablet Take 1 tablet (15 mg total) by mouth daily. Prn pain 01/29/20   Mauro Elveria BROCKS, NP  methocarbamol  (ROBAXIN ) 500 MG tablet Take 1 tablet (500 mg total) by mouth every 6 (six) hours as needed for muscle spasms. 07/25/23   Cook, Jayce G, DO  metoprolol  tartrate (LOPRESSOR ) 25 MG tablet Take 1 tablet (25 mg total) 2 hours before cardiac ct 10/20/23   Delford Maude BROCKS, MD  mirabegron  ER (MYRBETRIQ ) 25 MG TB24 tablet Take 1 tablet (25 mg total) by mouth daily. 10/19/21 01/26/23  Ozan, Jennifer, DO  Multiple Vitamins-Minerals (MULTIVITAMIN WITH MINERALS) tablet Take 1 tablet by mouth daily after lunch.     [provider]  OVER THE COUNTER MEDICATION Vitamin C and zinc Black cohosh 40 mg Goli Centrum silver women 50 +    [provider]  OVER THE COUNTER MEDICATION Probiotics    [provider]  rosuvastatin  (CRESTOR ) 20 MG tablet Take 1 tablet (20 mg total) by mouth  daily. 10/21/23 01/19/24  Nishan, Peter C, MD  SUPER B COMPLEX/C PO Take 1 tablet by mouth daily after lunch.    [provider]  traMADol  (ULTRAM ) 50 MG tablet Take by mouth every 6 (six) hours as needed for moderate pain (pain score 4-6). 11/24/16   [provider]  vitamin B-12 (CYANOCOBALAMIN ) 1000 MCG tablet Take 1,000 mcg by mouth daily after lunch.    [provider]  vitamin E 200 UNIT capsule Take 200 Units by mouth daily after lunch.    [provider]    Allergies: Patient has no known allergies.    Review of Systems See HPI  Updated Vital Signs BP 132/84 (BP Location: Right Arm)   Pulse 82   Temp 98.6 F (37 C) (Oral)   Resp 18   SpO2 100%   Physical Exam Constitutional:      Appearance: Normal appearance.  HENT:     Head: Normocephalic.     Nose: Nose normal.  Eyes:     Conjunctiva/sclera: Conjunctivae normal.  Cardiovascular:     Pulses:          Dorsalis pedis pulses are 2+ on the right side and 2+ on the left side.  Pulmonary:     Effort: Pulmonary effort is normal.  Musculoskeletal:     Comments: No visible swelling erythema or ecchymosis  in the lower left leg.  Tenderness to palpation of along the anterior distal tib-fib.  Cap refill brisk distally.  Sensation intact to light touch.  Neurological:     Mental Status: She is alert.  Psychiatric:        Mood and Affect: Mood normal.     (all labs ordered are listed, but only abnormal results are displayed) Labs Reviewed - No data to display  EKG: None  Radiology: DG Ankle Complete Left Result Date: 04/05/2024 EXAM: 3 or more VIEW(S) XRAY OF THE LEFT ANKLE 04/05/2024 11:36:00 AM CLINICAL HISTORY: pain COMPARISON: 12/13/2012 FINDINGS: BONES AND JOINTS: The talar dome is intact. No widening of the ankle mortise. No acute fracture or dislocation. No focal osseous lesion. SOFT TISSUES: The soft tissues are unremarkable. IMPRESSION: 1. No acute fracture or dislocation.  Electronically signed by: Rogelia Myers MD 04/05/2024 12:11 PM EST RP Workstation: [id number]   DG Tibia/Fibula Left Result Date: 04/05/2024 EXAM: _VIEWS_ VIEW(S) XRAY OF THE LEFT TIBIA AND FIBULA 04/05/2024 11:36:00 AM COMPARISON: None available. CLINICAL HISTORY: pain FINDINGS: BONES AND JOINTS: No acute fracture or dislocation. Achilles insertion enthesophyte. SOFT TISSUES: The soft tissues are unremarkable. IMPRESSION: 1. No acute fracture or dislocation. Electronically signed by: Rogelia Myers MD 04/05/2024 12:01 PM EST RP Workstation: [id number]     Procedures   Medications Ordered in the ED  ibuprofen  (ADVIL ) tablet 800 mg (800 mg Oral Given 04/05/24 1126)                                    Medical Decision Making Amount and/or Complexity of Data Reviewed Radiology: ordered.  Risk Prescription drug management.   58 year old well-appearing female presenting for lower leg pain.  Exam was unremarkable.  No obvious evidence of infection, trauma or DVT.  I personally reviewed and interpreted x-rays and agree with radiologist that there are no acute fractures or dislocations.  Able to ambulate and bear weight with normal-appearing gait.  Advised RICE treatment and NSAIDs at home and to follow-up with her PCP.  Discharge.     Final diagnoses:  Left leg pain    ED Discharge Orders     None          Lang Norleen MARLA DEVONNA 04/05/24 1216    Garrick Charleston, MD 04/05/24 1310

## 2024-04-10 ENCOUNTER — Ambulatory Visit: Payer: Self-pay | Admitting: Family Medicine

## 2024-04-10 VITALS — BP 147/82 | HR 81 | Temp 97.5°F | Ht 67.0 in | Wt 167.0 lb

## 2024-04-10 DIAGNOSIS — M79605 Pain in left leg: Secondary | ICD-10-CM | POA: Diagnosis not present

## 2024-04-10 DIAGNOSIS — R058 Other specified cough: Secondary | ICD-10-CM | POA: Diagnosis not present

## 2024-04-10 DIAGNOSIS — M5412 Radiculopathy, cervical region: Secondary | ICD-10-CM

## 2024-04-10 DIAGNOSIS — R200 Anesthesia of skin: Secondary | ICD-10-CM

## 2024-04-10 MED ORDER — PREDNISONE 10 MG PO TABS: ORAL_TABLET | ORAL | 0 refills | Status: AC

## 2024-04-10 NOTE — Assessment & Plan Note (Signed)
 Prednisone as directed.

## 2024-04-10 NOTE — Assessment & Plan Note (Signed)
 Etiology unclear.  X-ray imaging negative.  No clinical evidence of DVT.  Prednisone  as directed.

## 2024-04-10 NOTE — Progress Notes (Signed)
 Subjective:  Patient ID: Holly Little, female    DOB: 1965-11-17  Age: 58 y.o. MRN: [id number]  CC:   Chief Complaint  Patient presents with   ER follow up     Problems with left leg, pt states was concerned w/ ER not working her up for blood clot, only leg xray was done. Pt also c/o left arm numb, weak, lack of circulation   dry cough    At night 1 week    HPI:  58 year old female presents for evaluation of the above.  Patient recently seen in the ER regarding swelling and pain of her left lower extremity.  X-ray imaging was negative.  Patient was advised to use to rest, ice, elevation and NSAIDs.  There was no clinical evidence of clot.  She states that her swelling has improved but she is still having some pain.  The area is located to the anterior ankle and the anterior left lower leg.  Patient also reports continued/ongoing numbness of the left arm.  Starts proximally.  Numbness extends to the 4th and 5th digits.  Has had prior x-ray imaging of the cervical spine which revealed multilevel degenerative disc disease.  Additionally, patient has been experiencing a dry cough at night over the past week.  Patient Active Problem List   Diagnosis Date Noted   Acute pain of left lower extremity 04/10/2024   Dry cough 04/10/2024   Annual physical exam 10/26/2023   Left arm numbness 07/25/2023   Urge urinary incontinence 08/23/2021    Social Hx   Social History   Socioeconomic History   Marital status: Divorced    Spouse name: Not on file   Number of children: Not on file   Years of education: Not on file   Highest education level: Not on file  Occupational History   Not on file  Tobacco Use   Smoking status: Never   Smokeless tobacco: Never  Vaping Use   Vaping status: Never Used  Substance and Sexual Activity   Alcohol use: No   Drug use: No   Sexual activity: Not Currently    Birth control/protection: Post-menopausal  Other Topics Concern   Not on file   Social History Narrative   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   Social Drivers of Corporate Investment Banker Strain: Low Risk  (10/19/2021)   Overall Financial Resource Strain (CARDIA)    Difficulty of Paying Living Expenses: Not very hard  Food Insecurity: Food Insecurity Present (10/19/2021)   Hunger Vital Sign    Worried About Running Out of Food in the Last Year: Sometimes true    Ran Out of Food in the Last Year: Sometimes true  Transportation Needs: No Transportation Needs (10/19/2021)   PRAPARE - Administrator, Civil Service (Medical): No    Lack of Transportation (Non-Medical): No  Physical Activity: Insufficiently Active (10/19/2021)   Exercise Vital Sign    Days of Exercise per Week: 4 days    Minutes of Exercise per Session: 30 min  Stress: No Stress Concern Present (10/19/2021)   [employer] of Occupational Health - Occupational Stress Questionnaire    Feeling of Stress : Only a little  Social Connections: Moderately Integrated (10/19/2021)   Social Connection and Isolation Panel    Frequency of Communication with Friends and Family: More than three times a week    Frequency of Social Gatherings with Friends and Family: Once  a week    Attends Religious Services: More than 4 times per year    Active Member of Clubs or Organizations: Yes    Attends Banker Meetings: 1 to 4 times per year    Marital Status: Divorced    Review of Systems Per HPI  Objective:  BP (!) 147/82   Pulse 81   Temp (!) 97.5 F (36.4 C)   Ht 5' 7 (1.702 m)   Wt 167 lb (75.8 kg)   SpO2 97%   BMI 26.16 kg/m      04/10/2024    9:39 AM 04/05/2024   10:21 AM 11/14/2023   10:30 AM  BP/Weight  Systolic BP 147 132 118  Diastolic BP 82 84 74  Wt. (Lbs) 167    BMI 26.16 kg/m2      Physical Exam Vitals and nursing note reviewed.  Constitutional:      General: She is not in acute distress.    Appearance: Normal  appearance.  HENT:     Head: Normocephalic and atraumatic.  Cardiovascular:     Rate and Rhythm: Normal rate and regular rhythm.  Pulmonary:     Effort: Pulmonary effort is normal.     Breath sounds: Normal breath sounds. No wheezing, rhonchi or rales.  Musculoskeletal:     Comments: Normal strength of the left upper extremity.  Patient endorsing numbness of the 4th and 5th digits.  Left lower leg -mild hyperpigmentation noted to the distal anterior lower leg.  No appreciable swelling.  Good range of motion of the ankle.  Neurological:     Mental Status: She is alert.     Lab Results  Component Value Date   WBC 5.6 07/25/2023   HGB 12.3 07/25/2023   HCT 40.1 07/25/2023   PLT 185 07/25/2023   GLUCOSE 78 10/20/2023   CHOL 196 10/20/2023   TRIG 137 10/20/2023   HDL 35 (L) 10/20/2023   LDLCALC 134 (H) 10/20/2023   ALT 14 10/20/2023   AST 16 10/20/2023   NA 138 10/20/2023   K 3.5 10/20/2023   CL 103 10/20/2023   CREATININE 0.68 10/20/2023   BUN 7 10/20/2023   CO2 28 10/20/2023   TSH 1.700 10/21/2022     Assessment & Plan:  Dry cough Assessment & Plan: Prednisone  as directed.    Left arm numbness Assessment & Plan: Concern for cervical radiculopathy.  Arranging MRI.  Placing on prednisone .  Orders: -     predniSONE ; 50 mg daily x 2 days, then 40 mg daily x 2 days, then 30 mg daily x 2 days, then 20 mg daily x 2 days, then 10 mg daily x 2 days.  Dispense: 30 tablet; Refill: 0 -     MR CERVICAL SPINE WO CONTRAST  Cervical radiculopathy -     predniSONE ; 50 mg daily x 2 days, then 40 mg daily x 2 days, then 30 mg daily x 2 days, then 20 mg daily x 2 days, then 10 mg daily x 2 days.  Dispense: 30 tablet; Refill: 0 -     MR CERVICAL SPINE WO CONTRAST  Acute pain of left lower extremity Assessment & Plan: Etiology unclear.  X-ray imaging negative.  No clinical evidence of DVT.  Prednisone  as directed.  Orders: -     predniSONE ; 50 mg daily x 2 days, then 40 mg  daily x 2 days, then 30 mg daily x 2 days, then 20 mg daily x 2 days, then 10 mg  daily x 2 days.  Dispense: 30 tablet; Refill: 0    Follow-up:  Pending imaging results  Holly Holly Little Bluford DO [facility]

## 2024-04-10 NOTE — Patient Instructions (Signed)
 Medication as directed.  Arranging MRI.  We will call or message with results.  Take care  Dr. Bluford

## 2024-04-10 NOTE — Assessment & Plan Note (Signed)
 Concern for cervical radiculopathy.  Arranging MRI.  Placing on prednisone .

## 2024-04-30 ENCOUNTER — Encounter (INDEPENDENT_AMBULATORY_CARE_PROVIDER_SITE_OTHER): Payer: Self-pay | Admitting: *Deleted

## 2024-05-04 ENCOUNTER — Telehealth: Payer: Self-pay | Admitting: *Deleted

## 2024-05-04 ENCOUNTER — Ambulatory Visit (HOSPITAL_COMMUNITY): Admission: RE | Admit: 2024-05-04 | Discharge: 2024-05-04 | Attending: Family Medicine | Admitting: Family Medicine

## 2024-05-04 DIAGNOSIS — R200 Anesthesia of skin: Secondary | ICD-10-CM | POA: Diagnosis not present

## 2024-05-04 DIAGNOSIS — M5030 Other cervical disc degeneration, unspecified cervical region: Secondary | ICD-10-CM | POA: Diagnosis not present

## 2024-05-04 DIAGNOSIS — M5412 Radiculopathy, cervical region: Secondary | ICD-10-CM | POA: Diagnosis not present

## 2024-05-04 NOTE — Telephone Encounter (Signed)
 Procedure: COLONOSCOPY  Estimated body mass index is 26.16 kg/m as calculated from the following:   Height as of 04/10/24: 5' 7 (1.702 m).   Weight as of 04/10/24: 167 lb (75.8 kg).   Have you had a colonoscopy before?  2019, Dr. Harvey  Do you have family history of colon cancer?  no  Do you have a family history of polyps? yes  Previous colonoscopy with polyps removed?   Do you have a history colorectal cancer?   no  Are you diabetic?  no  Do you have a prosthetic or mechanical heart valve? no  Do you have a pacemaker/defibrillator?   no  Have you had endocarditis/atrial fibrillation?  no  Do you use supplemental oxygen/CPAP?  no  Have you had joint replacement within the last 12 months?  no  Do you tend to be constipated or have to use laxatives?  no   Do you have history of alcohol use? If yes, how much and how often.  no  Do you have history or are you using drugs? If yes, what do are you  using?  no  Have you ever had a stroke/heart attack?  no  Have you ever had a heart or other vascular stent placed,?no  Do you take weight loss medication? no  female patients,: have you had a hysterectomy? no                              are you post menopausal?  no                              do you still have your menstrual cycle? no    Date of last menstrual period?   Do you take any blood-thinning medications such as: (Plavix, aspirin, Coumadin, Aggrenox, Brilinta, Xarelto, Eliquis, Pradaxa, Savaysa or Effient)? no  If yes we need the name, milligram, dosage and who is prescribing doctor:               Current Outpatient Medications  Medication Sig Dispense Refill   APPLE CIDER VINEGAR PO Take 450 mg by mouth daily after lunch.     Biotin 89999 MCG TABS Take 10,000 mcg by mouth daily after lunch.     cholecalciferol (VITAMIN D) 25 MCG (1000 UT) tablet Take 1,000 Units by mouth daily after lunch.     meloxicam  (MOBIC ) 15 MG tablet Take 1 tablet (15 mg total) by  mouth daily. Prn pain 30 tablet 0   methocarbamol  (ROBAXIN ) 500 MG tablet Take 1 tablet (500 mg total) by mouth every 6 (six) hours as needed for muscle spasms. 90 tablet 1   metoprolol  tartrate (LOPRESSOR ) 25 MG tablet Take 1 tablet (25 mg total) 2 hours before cardiac ct 1 tablet 0   mirabegron  ER (MYRBETRIQ ) 25 MG TB24 tablet Take 1 tablet (25 mg total) by mouth daily. 30 tablet 6   Multiple Vitamins-Minerals (MULTIVITAMIN WITH MINERALS) tablet Take 1 tablet by mouth daily after lunch.      OVER THE COUNTER MEDICATION Vitamin C and zinc Black cohosh 40 mg Goli Centrum silver women 50 +     OVER THE COUNTER MEDICATION Probiotics     predniSONE  (DELTASONE ) 10 MG tablet 50 mg daily x 2 days, then 40 mg daily x 2 days, then 30 mg daily x 2 days, then 20 mg daily x 2 days, then 10  mg daily x 2 days. 30 tablet 0   rosuvastatin  (CRESTOR ) 20 MG tablet Take 1 tablet (20 mg total) by mouth daily. 90 tablet 3   SUPER B COMPLEX/C PO Take 1 tablet by mouth daily after lunch.     traMADol  (ULTRAM ) 50 MG tablet Take by mouth every 6 (six) hours as needed for moderate pain (pain score 4-6).     vitamin B-12 (CYANOCOBALAMIN ) 1000 MCG tablet Take 1,000 mcg by mouth daily after lunch.     vitamin E 200 UNIT capsule Take 200 Units by mouth daily after lunch.     Current Facility-Administered Medications  Medication Dose Route Frequency Provider Last Rate Last Admin   promethazine  (PHENERGAN ) 12.5 mg in sodium chloride  0.9 % 1,000 mL infusion  12.5 mg Intravenous Once Fields, Sandi L, MD        No Known Allergies

## 2024-05-08 ENCOUNTER — Ambulatory Visit: Payer: Self-pay | Admitting: Family Medicine

## 2024-05-08 NOTE — Progress Notes (Signed)
Called patient and left voicemail to return our call.

## 2024-05-10 NOTE — Telephone Encounter (Signed)
 ASA 2. Appropriate. However, need to make sure we discuss with her FH colon cancer. It appears she has FH colon cancer in father age 60 looking at prior notes, but she answered no on questionnaire.

## 2024-05-11 NOTE — Telephone Encounter (Signed)
 LMOVM to call back

## 2024-05-15 NOTE — Telephone Encounter (Signed)
 LMOVM to call back letter also mailed.

## 2024-05-18 NOTE — Telephone Encounter (Signed)
" °  Copied from CRM #[id number]. Topic: Clinical - Lab/Test Results >> May 08, 2024  4:47 PM Sophia H wrote: Reason for CRM: Returning call to [facility] regarding imaging results, no answer at [facility]. Please reach out # [phone]  -- I spoke with patient and informed per provider "

## 2024-05-28 ENCOUNTER — Other Ambulatory Visit: Payer: Self-pay | Admitting: *Deleted

## 2024-05-28 ENCOUNTER — Encounter: Payer: Self-pay | Admitting: *Deleted

## 2024-05-28 MED ORDER — PEG 3350-KCL-NA BICARB-NACL 420 G PO SOLR: 4000.0000 mL | Freq: Once | ORAL | 0 refills | Status: AC

## 2024-05-28 NOTE — Telephone Encounter (Signed)
 Pt has been scheduled with Dr.Carver on 06/12/24. Instructions mailed and prep sent to pharmacy. Pt stated that her father did have colon cancer.

## 2024-05-28 NOTE — Progress Notes (Signed)
 Called patient and left voicemail to give [place] a call back.

## 2024-05-29 ENCOUNTER — Other Ambulatory Visit: Payer: Self-pay

## 2024-05-29 DIAGNOSIS — M5030 Other cervical disc degeneration, unspecified cervical region: Secondary | ICD-10-CM

## 2024-06-05 ENCOUNTER — Encounter: Payer: Self-pay | Admitting: *Deleted

## 2024-06-05 NOTE — Telephone Encounter (Signed)
 Pt needed to reschedule her procedure on 06/12/24 due to daughter going to college and having to move her. She has been rescheduled to 06/19/24 at 10:30 am. Updated instructions mailed.

## 2024-06-05 NOTE — Telephone Encounter (Signed)
 LMOVM to return call  Pt left vm wanting to reschedule her procedure.

## 2024-06-08 ENCOUNTER — Encounter (HOSPITAL_COMMUNITY)
Admission: RE | Admit: 2024-06-08 | Discharge: 2024-06-08 | Disposition: A | Source: Ambulatory Visit | Attending: Internal Medicine

## 2024-06-08 NOTE — Pre-Procedure Instructions (Signed)
 Attempted pre-op phonecall. Left VM for her to call us  back.

## 2024-06-11 NOTE — Progress Notes (Signed)
 Attempted pre op cal but no answer.  Contacted her brother who states he will get in touch with her and have her call us  back.

## 2024-06-11 NOTE — Progress Notes (Signed)
Patient rescheduled to a later date.

## 2024-06-19 ENCOUNTER — Encounter (HOSPITAL_COMMUNITY)
Admission: RE | Disposition: A | Discharge status: Home / Self Care | Payer: Self-pay | Source: Home / Self Care | Attending: Internal Medicine

## 2024-06-19 ENCOUNTER — Ambulatory Visit (HOSPITAL_COMMUNITY)
Admission: RE | Admit: 2024-06-19 | Discharge: 2024-06-19 | Disposition: A | Discharge status: Home / Self Care | Attending: Internal Medicine | Admitting: Internal Medicine

## 2024-06-19 ENCOUNTER — Encounter (HOSPITAL_COMMUNITY): Payer: Self-pay | Admitting: Internal Medicine

## 2024-06-19 ENCOUNTER — Ambulatory Visit (HOSPITAL_COMMUNITY)

## 2024-06-19 DIAGNOSIS — K648 Other hemorrhoids: Secondary | ICD-10-CM | POA: Diagnosis not present

## 2024-06-19 DIAGNOSIS — Z800 Family history of malignant neoplasm of digestive organs: Secondary | ICD-10-CM | POA: Diagnosis not present

## 2024-06-19 DIAGNOSIS — Z1211 Encounter for screening for malignant neoplasm of colon: Secondary | ICD-10-CM | POA: Insufficient documentation

## 2024-06-19 HISTORY — PX: COLONOSCOPY: SHX5424

## 2024-06-19 LAB — HM COLONOSCOPY

## 2024-06-19 MED ORDER — PROPOFOL 500 MG/50ML IV EMUL
INTRAVENOUS | Status: DC | PRN
  Administered 2024-06-19: 150 ug/kg/min via INTRAVENOUS
  Administered 2024-06-19: 100 mg via INTRAVENOUS

## 2024-06-19 MED ORDER — LACTATED RINGERS IV SOLN: INTRAVENOUS | Status: DC

## 2024-06-19 NOTE — Op Note (Signed)
 [facility] Patient Name: Holly Little Procedure Date: 06/19/2024 10:43 AM MRN: [id number] Date of Birth: 1965/08/08 Attending MD: Carlin POUR. Cindie , [place], [id number] CSN: [id number] Age: 59 Admit Type: Outpatient Procedure:                Colonoscopy Indications:              Screening in patient at increased risk: Family                            history of 1st-degree relative with colorectal                            cancer Providers:                Carlin POUR. Cindie, DO, Ashley Goins, Daphne Mulch                            Technician, Technician Referring MD:              Medicines:                See the Anesthesia note for documentation of the                            administered medications Complications:            No immediate complications. Estimated Blood Loss:     Estimated blood loss: none. Procedure:                Pre-Anesthesia Assessment:                           - The anesthesia plan was to use monitored                            anesthesia care (MAC).                           After obtaining informed consent, the colonoscope                            was passed under direct vision. Throughout the                            procedure, the patient's blood pressure, pulse, and                            oxygen saturations were monitored continuously. The                            PCF-HQ190L ([id number]) Peds Colon was introduced                            through the anus and advanced to the the cecum,                            identified by appendiceal orifice and ileocecal  valve. The colonoscopy was performed without                            difficulty. The patient tolerated the procedure                            well. The quality of the bowel preparation was                            evaluated using the BBPS [facility] Bowel Preparation                            Scale) with scores of: Right Colon = 2 (minor                             amount of residual staining, small fragments of                            stool and/or opaque liquid, but mucosa seen well),                            Transverse Colon = 2 (minor amount of residual                            staining, small fragments of stool and/or opaque                            liquid, but mucosa seen well) and Left Colon = 2                            (minor amount of residual staining, small fragments                            of stool and/or opaque liquid, but mucosa seen                            well). The total BBPS score equals 6. The quality                            of the bowel preparation was good. Scope In: 10:54:07 AM Scope Out: 11:06:02 AM Scope Withdrawal Time: 0 hours 8 minutes 21 seconds  Total Procedure Duration: 0 hours 11 minutes 55 seconds  Findings:      Non-bleeding internal hemorrhoids were found.      The exam was otherwise without abnormality. Impression:               - Non-bleeding internal hemorrhoids.                           - The examination was otherwise normal.                           - No specimens collected. Moderate Sedation:      Per Anesthesia Care Recommendation:           -  Patient has a contact number available for                            emergencies. The signs and symptoms of potential                            delayed complications were discussed with the                            patient. Return to normal activities tomorrow.                            Written discharge instructions were provided to the                            patient.                           - Resume previous diet.                           - Continue present medications.                           - Repeat colonoscopy in 5 years for screening                            purposes due to family history of colon cancer in                            father                           - Return to GI clinic PRN. Procedure  Code(s):        --- Professional ---                           H9894, Colorectal cancer screening; colonoscopy on                            individual at high risk Diagnosis Code(s):        --- Professional ---                           Z80.0, Family history of malignant neoplasm of                            digestive organs                           K64.8, Other hemorrhoids CPT copyright 2022 American Medical Association. All rights reserved. The codes documented in this report are preliminary and upon coder review may  be revised to meet current compliance requirements. Carlin POUR. Cindie, DO Carlin POUR. Virtie Bungert, DO 06/19/2024 11:08:30 AM This report has been signed electronically. Number of Addenda: 0

## 2024-06-19 NOTE — Anesthesia Postprocedure Evaluation (Signed)
"   Anesthesia Post Note  Patient: Holly Little  Procedure(s) Performed: COLONOSCOPY  Patient location during evaluation: PACU Anesthesia Type: General Level of consciousness: awake and alert Pain management: pain level controlled Vital Signs Assessment: post-procedure vital signs reviewed and stable Respiratory status: spontaneous breathing, nonlabored ventilation, respiratory function stable and patient connected to nasal cannula oxygen Cardiovascular status: blood pressure returned to baseline and stable Postop Assessment: no apparent nausea or vomiting Anesthetic complications: no   No notable events documented.   Last Vitals:  Vitals:   06/19/24 0917 06/19/24 1109  BP: 121/73 (!) 115/59  Pulse:  66  Resp: 16 (!) 22  Temp: 36.8 C   SpO2: 100% 100%    Last Pain:  Vitals:   06/19/24 1109  PainSc: 0-No pain                 Andrea Limes      "

## 2024-06-19 NOTE — Discharge Instructions (Addendum)
  Colonoscopy Discharge Instructions  Read the instructions outlined below and refer to this sheet in the next few weeks. These discharge instructions provide you with general information on caring for yourself after you leave the hospital. Your doctor may also give you specific instructions. While your treatment has been planned according to the most current medical practices available, unavoidable complications occasionally occur.   ACTIVITY You may resume your regular activity, but move at a slower pace for the next 24 hours.  Take frequent rest periods for the next 24 hours.  Walking will help get rid of the air and reduce the bloated feeling in your belly (abdomen).  No driving for 24 hours (because of the medicine (anesthesia) used during the test).   Do not sign any important legal documents or operate any machinery for 24 hours (because of the anesthesia used during the test).  NUTRITION Drink plenty of fluids.  You may resume your normal diet as instructed by your doctor.  Begin with a light meal and progress to your normal diet. Heavy or fried foods are harder to digest and may make you feel sick to your stomach (nauseated).  Avoid alcoholic beverages for 24 hours or as instructed.  MEDICATIONS You may resume your normal medications unless your doctor tells you otherwise.  WHAT YOU CAN EXPECT TODAY Some feelings of bloating in the abdomen.  Passage of more gas than usual.  Spotting of blood in your stool or on the toilet paper.  IF YOU HAD POLYPS REMOVED DURING THE COLONOSCOPY: No aspirin products for 7 days or as instructed.  No alcohol for 7 days or as instructed.  Eat a soft diet for the next 24 hours.  FINDING OUT THE RESULTS OF YOUR TEST Not all test results are available during your visit. If your test results are not back during the visit, make an appointment with your caregiver to find out the results. Do not assume everything is normal if you have not heard from your  caregiver or the medical facility. It is important for you to follow up on all of your test results.  SEEK IMMEDIATE MEDICAL ATTENTION IF: You have more than a spotting of blood in your stool.  Your belly is swollen (abdominal distention).  You are nauseated or vomiting.  You have a temperature over 101.  You have abdominal pain or discomfort that is severe or gets worse throughout the day.   Your colonoscopy was relatively unremarkable.  I did not find any polyps or evidence of colon cancer.  I recommend repeating colonoscopy in 5 years for colon cancer screening purposes given your family history. Follow-up with GI as needed.   I hope you have a great rest of your week!  Mckennon Zwart K. Don Tiu, D.O. Gastroenterology and Hepatology [facility]  

## 2024-06-19 NOTE — Anesthesia Preprocedure Evaluation (Signed)
"                                    Anesthesia Evaluation  Patient identified by MRN, date of birth, ID band Patient awake    Reviewed: Allergy & Precautions, H&P , NPO status , Patient's Chart, lab work & pertinent test results  History of Anesthesia Complications (+) Family history of anesthesia reaction  Airway Mallampati: II  TM Distance: >3 FB Neck ROM: Full    Dental no notable dental hx.    Pulmonary neg pulmonary ROS   Pulmonary exam normal breath sounds clear to auscultation       Cardiovascular negative cardio ROS Normal cardiovascular exam Rhythm:Regular Rate:Normal     Neuro/Psych  Headaches PSYCHIATRIC DISORDERS  Depression     Neuromuscular disease    GI/Hepatic negative GI ROS, Neg liver ROS,,,  Endo/Other  negative endocrine ROS    Renal/GU negative Renal ROS  negative genitourinary   Musculoskeletal negative musculoskeletal ROS (+)    Abdominal   Peds negative pediatric ROS (+)  Hematology  (+) Blood dyscrasia, anemia   Anesthesia Other Findings   Reproductive/Obstetrics negative OB ROS                              Anesthesia Physical Anesthesia Plan  ASA: 2  Anesthesia Plan: General   Post-op Pain Management:    Induction: Intravenous  PONV Risk Score and Plan:   Airway Management Planned: Nasal Cannula and Natural Airway  Additional Equipment:   Intra-op Plan:   Post-operative Plan:   Informed Consent: I have reviewed the patients History and Physical, chart, labs and discussed the procedure including the risks, benefits and alternatives for the proposed anesthesia with the patient or authorized representative who has indicated his/her understanding and acceptance.     Dental advisory given  Plan Discussed with: CRNA  Anesthesia Plan Comments:         Anesthesia Quick Evaluation  "
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
## 2024-06-19 NOTE — Transfer of Care (Signed)
 Immediate Anesthesia Transfer of Care Note  Patient: Holly Little  Procedure(s) Performed: COLONOSCOPY  Patient Location: Short Stay  Anesthesia Type:General  Level of Consciousness: drowsy  Airway & Oxygen Therapy: Patient Spontanous Breathing  Post-op Assessment: Report given to RN and Post -op Vital signs reviewed and stable  Post vital signs: Reviewed and stable  Last Vitals:  Vitals Value Taken Time  BP 115/59 06/19/24 11:09  Temp    Pulse 66 06/19/24 11:09  Resp 22 06/19/24 11:09  SpO2 100 % 06/19/24 11:09    Last Pain:  Vitals:   06/19/24 1109  PainSc: 0-No pain         Complications: No notable events documented.

## 2024-06-20 ENCOUNTER — Encounter (HOSPITAL_COMMUNITY): Payer: Self-pay | Admitting: Internal Medicine

## 2024-06-20 ENCOUNTER — Encounter (INDEPENDENT_AMBULATORY_CARE_PROVIDER_SITE_OTHER): Payer: Self-pay | Admitting: *Deleted

## 2024-07-24 ENCOUNTER — Ambulatory Visit (HOSPITAL_COMMUNITY)
